# Patient Record
Sex: Female | Born: 1985 | Race: Black or African American | Hispanic: No | Marital: Single | Smoking: Never smoker
Health system: Southern US, Community
[De-identification: ages and names within clinical notes are randomized; demographics above are authoritative.]

## PROBLEM LIST (undated history)

## (undated) DIAGNOSIS — Z789 Other specified health status: Secondary | ICD-10-CM

## (undated) HISTORY — DX: Other specified health status: Z78.9

---

## 2012-03-26 NOTE — L&D Delivery Note (Signed)
Delivery Note At 12:11 AM a viable female was delivered via VBAC, Spontaneous (Presentation: Right Occiput Anterior).  APGAR: 9, 9; weight pending.   Placenta status: Intact, Spontaneous.  Cord: 3 vessels with the following complications: None.  Cord pH: NA.  Anesthesia: None  Episiotomy: None Lacerations: 2nd degree;Perineal;Cervical Suture Repair: 3.0 vicryl rapide Est. Blood Loss (mL): 450  Mom to postpartum.  Baby to Couplet care / Skin to Skin. Placenta to: BS Feeding: Breast Circ: NA Contraception: POPs/Depo  Casey Gonzalez 03/24/2013, 1:21 AM

## 2013-01-07 ENCOUNTER — Encounter: Payer: Self-pay | Admitting: Obstetrics & Gynecology

## 2013-01-07 ENCOUNTER — Ambulatory Visit (INDEPENDENT_AMBULATORY_CARE_PROVIDER_SITE_OTHER): Payer: Self-pay

## 2013-01-07 DIAGNOSIS — Z3201 Encounter for pregnancy test, result positive: Secondary | ICD-10-CM

## 2013-01-07 DIAGNOSIS — Z3200 Encounter for pregnancy test, result unknown: Secondary | ICD-10-CM

## 2013-01-08 LAB — OBSTETRIC PANEL
Basophils Relative: 0 % (ref 0–1)
Eosinophils Absolute: 0.2 10*3/uL (ref 0.0–0.7)
HCT: 34 % — ABNORMAL LOW (ref 36.0–46.0)
Hepatitis B Surface Ag: NEGATIVE
Lymphs Abs: 2 10*3/uL (ref 0.7–4.0)
MCH: 28.1 pg (ref 26.0–34.0)
MCHC: 31.8 g/dL (ref 30.0–36.0)
Monocytes Absolute: 0.8 10*3/uL (ref 0.1–1.0)
Neutro Abs: 8.7 10*3/uL — ABNORMAL HIGH (ref 1.7–7.7)
Neutrophils Relative %: 75 % (ref 43–77)
Platelets: 319 10*3/uL (ref 150–400)
RBC: 3.85 MIL/uL — ABNORMAL LOW (ref 3.87–5.11)
Rubella: 4.77 Index — ABNORMAL HIGH (ref <0.90)
WBC: 11.7 10*3/uL — ABNORMAL HIGH (ref 4.0–10.5)

## 2013-01-09 LAB — HEMOGLOBINOPATHY EVALUATION
Hemoglobin Other: 0 %
Hgb A2 Quant: 1.3 % — ABNORMAL LOW (ref 2.2–3.2)
Hgb A: 98.7 % — ABNORMAL HIGH (ref 96.8–97.8)
Hgb S Quant: 0 %

## 2013-01-09 LAB — PRESCRIPTION MONITORING PROFILE (19 PANEL)
Amphetamine/Meth: NEGATIVE ng/mL
Barbiturate Screen, Urine: NEGATIVE ng/mL
Benzodiazepine Screen, Urine: NEGATIVE ng/mL
Buprenorphine, Urine: NEGATIVE ng/mL
Carisoprodol, Urine: NEGATIVE ng/mL
MDMA URINE: NEGATIVE ng/mL
Meperidine, Ur: NEGATIVE ng/mL
Methaqualone: NEGATIVE ng/mL
Nitrites, Initial: NEGATIVE ug/mL
Oxycodone Screen, Ur: NEGATIVE ng/mL
Propoxyphene: NEGATIVE ng/mL
Tapentadol, urine: NEGATIVE ng/mL
Tramadol Scrn, Ur: NEGATIVE ng/mL
Zolpidem, Urine: NEGATIVE ng/mL

## 2013-01-12 ENCOUNTER — Ambulatory Visit (HOSPITAL_COMMUNITY): Admission: RE | Admit: 2013-01-12 | Payer: Self-pay | Source: Ambulatory Visit

## 2013-01-12 ENCOUNTER — Encounter (HOSPITAL_COMMUNITY): Payer: Self-pay

## 2013-01-12 ENCOUNTER — Ambulatory Visit (HOSPITAL_COMMUNITY)
Admission: RE | Admit: 2013-01-12 | Discharge: 2013-01-12 | Disposition: A | Discharge status: Home / Self Care | Payer: Self-pay | Source: Ambulatory Visit | Attending: Obstetrics & Gynecology | Admitting: Obstetrics & Gynecology

## 2013-01-12 DIAGNOSIS — Z3689 Encounter for other specified antenatal screening: Secondary | ICD-10-CM | POA: Insufficient documentation

## 2013-01-12 DIAGNOSIS — O0930 Supervision of pregnancy with insufficient antenatal care, unspecified trimester: Secondary | ICD-10-CM | POA: Insufficient documentation

## 2013-01-12 DIAGNOSIS — Z3200 Encounter for pregnancy test, result unknown: Secondary | ICD-10-CM

## 2013-01-13 ENCOUNTER — Encounter: Payer: Self-pay | Admitting: Obstetrics & Gynecology

## 2013-01-13 DIAGNOSIS — O0930 Supervision of pregnancy with insufficient antenatal care, unspecified trimester: Secondary | ICD-10-CM | POA: Insufficient documentation

## 2013-01-15 ENCOUNTER — Ambulatory Visit (INDEPENDENT_AMBULATORY_CARE_PROVIDER_SITE_OTHER): Payer: Self-pay | Admitting: Family

## 2013-01-15 ENCOUNTER — Encounter: Payer: Self-pay | Admitting: Family

## 2013-01-15 VITALS — BP 130/83 | Temp 98.3°F | Ht 66.0 in | Wt 227.1 lb

## 2013-01-15 DIAGNOSIS — O0930 Supervision of pregnancy with insufficient antenatal care, unspecified trimester: Secondary | ICD-10-CM

## 2013-01-15 DIAGNOSIS — Z98891 History of uterine scar from previous surgery: Secondary | ICD-10-CM

## 2013-01-15 DIAGNOSIS — O0933 Supervision of pregnancy with insufficient antenatal care, third trimester: Secondary | ICD-10-CM

## 2013-01-15 DIAGNOSIS — O34219 Maternal care for unspecified type scar from previous cesarean delivery: Secondary | ICD-10-CM | POA: Insufficient documentation

## 2013-01-15 LAB — POCT URINALYSIS DIP (DEVICE)
Bilirubin Urine: NEGATIVE
Hgb urine dipstick: NEGATIVE
Ketones, ur: NEGATIVE mg/dL
Specific Gravity, Urine: <=1.005 (ref 1.005–1.030)
pH: 6.5 (ref 5.0–8.0)

## 2013-01-15 LAB — GLUCOSE TOLERANCE, 1 HOUR (50G) W/O FASTING: Glucose, 1 Hour GTT: 105 mg/dL (ref 70–140)

## 2013-01-15 NOTE — Progress Notes (Signed)
Pulse- 119  Edema-ankles/feet  Pain/pressure-cramps/ when baby moves  Weight gain 25-35lb New ob packet given

## 2013-01-15 NOTE — Progress Notes (Signed)
New OB visit; too late for genetic testing.  Reviewed lab and ultrasound results with patient. Obtain 1 hr test today.  Pap smear with GC/CT collected.  Desires TOLAC, obtain consent at next visit.  Exam   BP 130/83  Temp(Src) 98.3 F (36.8 C)  Ht 5\' 6"  (1.676 m)  Wt 227 lb 1.6 oz (103.012 kg)  BMI 36.67 kg/m2 Uterine Size: size equals dates  Pelvic Exam:    Perineum: No Hemorrhoids, Normal Perineum   Vulva: normal   Vagina:  normal mucosa, normal discharge, no palpable nodules   pH: Not done   Cervix: no bleeding following Pap, no cervical motion tenderness and no lesions   Adnexa: normal adnexa and no mass, fullness, tenderness   Bony Pelvis: Adequate  System: Breast:  No nipple retraction or dimpling, No nipple discharge or bleeding, No axillary or supraclavicular adenopathy, Normal to palpation without dominant masses   Skin: normal coloration and turgor, no rashes    Neurologic: negative   Extremities: normal strength, tone, and muscle mass   HEENT neck supple with midline trachea and thyroid without masses   Mouth/Teeth mucous membranes moist, pharynx normal without lesions   Neck supple and no masses   Cardiovascular: regular rate and rhythm, no murmurs or gallops   Respiratory:  appears well, vitals normal, no respiratory distress, acyanotic, normal RR, neck free of mass or lymphadenopathy, chest clear, no wheezing, crepitations, rhonchi, normal symmetric air entry   Abdomen: soft, non-tender; bowel sounds normal; no masses,  no organomegaly; lower csection scar seen on abdomen.   Urinary: urethral meatus normal

## 2013-01-16 ENCOUNTER — Encounter: Payer: Self-pay | Admitting: Family

## 2013-01-20 ENCOUNTER — Encounter: Payer: Self-pay | Admitting: Family

## 2013-01-26 ENCOUNTER — Encounter: Payer: Self-pay | Admitting: Obstetrics & Gynecology

## 2013-01-29 ENCOUNTER — Encounter: Payer: Self-pay | Admitting: Obstetrics & Gynecology

## 2013-02-02 ENCOUNTER — Ambulatory Visit (INDEPENDENT_AMBULATORY_CARE_PROVIDER_SITE_OTHER): Payer: Self-pay | Admitting: Obstetrics & Gynecology

## 2013-02-02 VITALS — BP 131/84 | Wt 230.8 lb

## 2013-02-02 DIAGNOSIS — O0930 Supervision of pregnancy with insufficient antenatal care, unspecified trimester: Secondary | ICD-10-CM

## 2013-02-02 DIAGNOSIS — O0933 Supervision of pregnancy with insufficient antenatal care, third trimester: Secondary | ICD-10-CM

## 2013-02-02 DIAGNOSIS — O34219 Maternal care for unspecified type scar from previous cesarean delivery: Secondary | ICD-10-CM

## 2013-02-02 LAB — POCT URINALYSIS DIP (DEVICE)
Bilirubin Urine: NEGATIVE
Glucose, UA: NEGATIVE mg/dL
Ketones, ur: NEGATIVE mg/dL
Specific Gravity, Urine: 1.02 (ref 1.005–1.030)
pH: 6.5 (ref 5.0–8.0)

## 2013-02-02 NOTE — Patient Instructions (Signed)
Return to clinic for any obstetric concerns or go to [facility] for evaluation  

## 2013-02-02 NOTE — Progress Notes (Signed)
Pulse = 112 

## 2013-02-02 NOTE — Progress Notes (Signed)
Interpreter is present.  TOLAC consent signed.  No other complaints or concerns.  Fetal movement and labor precautions reviewed.

## 2013-02-05 ENCOUNTER — Encounter: Payer: Self-pay | Admitting: *Deleted

## 2013-02-16 ENCOUNTER — Ambulatory Visit (INDEPENDENT_AMBULATORY_CARE_PROVIDER_SITE_OTHER): Payer: Self-pay | Admitting: Obstetrics & Gynecology

## 2013-02-16 ENCOUNTER — Encounter: Payer: Self-pay | Admitting: Advanced Practice Midwife

## 2013-02-16 VITALS — BP 125/82 | Wt 234.4 lb

## 2013-02-16 DIAGNOSIS — O34219 Maternal care for unspecified type scar from previous cesarean delivery: Secondary | ICD-10-CM

## 2013-02-16 DIAGNOSIS — Z1389 Encounter for screening for other disorder: Secondary | ICD-10-CM

## 2013-02-16 DIAGNOSIS — O0933 Supervision of pregnancy with insufficient antenatal care, third trimester: Secondary | ICD-10-CM

## 2013-02-16 DIAGNOSIS — Z0489 Encounter for examination and observation for other specified reasons: Secondary | ICD-10-CM

## 2013-02-16 DIAGNOSIS — O0930 Supervision of pregnancy with insufficient antenatal care, unspecified trimester: Secondary | ICD-10-CM

## 2013-02-16 NOTE — Progress Notes (Signed)
Interpreter present.  OB follow up scan given inadequate scan at 30 weeks.  Counseled about Tdap and Flu vaccines, patient to call [facility] to obtain these if interested.  No other complaints or concerns.  Fetal movement and labor precautions reviewed.  Pelvic cultures next week.

## 2013-02-16 NOTE — Patient Instructions (Addendum)
   Call [facility]  [phone] for Tdap and Flu vaccines.  Mention you are Adopt a Mom.  Return to clinic for any obstetric concerns or go to [facility] for evaluation

## 2013-02-16 NOTE — Progress Notes (Signed)
Pulse 111  

## 2013-02-17 ENCOUNTER — Encounter: Payer: Self-pay | Admitting: Obstetrics & Gynecology

## 2013-02-23 ENCOUNTER — Encounter: Payer: Self-pay | Admitting: Obstetrics and Gynecology

## 2013-02-23 ENCOUNTER — Ambulatory Visit (INDEPENDENT_AMBULATORY_CARE_PROVIDER_SITE_OTHER): Payer: Self-pay | Admitting: Obstetrics and Gynecology

## 2013-02-23 VITALS — BP 117/81 | Temp 97.1°F | Wt 237.0 lb

## 2013-02-23 DIAGNOSIS — O0933 Supervision of pregnancy with insufficient antenatal care, third trimester: Secondary | ICD-10-CM

## 2013-02-23 DIAGNOSIS — O34219 Maternal care for unspecified type scar from previous cesarean delivery: Secondary | ICD-10-CM

## 2013-02-23 DIAGNOSIS — O0930 Supervision of pregnancy with insufficient antenatal care, unspecified trimester: Secondary | ICD-10-CM

## 2013-02-23 LAB — OB RESULTS CONSOLE GC/CHLAMYDIA
Chlamydia: NEGATIVE
Gonorrhea: NEGATIVE

## 2013-02-23 LAB — POCT URINALYSIS DIP (DEVICE)
Bilirubin Urine: NEGATIVE
Hgb urine dipstick: NEGATIVE
Protein, ur: NEGATIVE mg/dL
Urobilinogen, UA: 0.2 mg/dL (ref 0.0–1.0)

## 2013-02-23 LAB — OB RESULTS CONSOLE GBS: GBS: POSITIVE

## 2013-02-23 NOTE — Addendum Note (Signed)
Addended by: Catalina Antigua on: 02/23/2013 04:20 PM   Modules accepted: Orders

## 2013-02-23 NOTE — Progress Notes (Signed)
Pulse: 102

## 2013-02-23 NOTE — Progress Notes (Signed)
Patient doing well without complaints. Cultures collected. Patient reports fetal movement but not as strong as previously. Discussed to keep track of kick out and to come in for decreased fetal movement. FM/PTL precautions reviewed.

## 2013-02-24 ENCOUNTER — Ambulatory Visit (HOSPITAL_COMMUNITY)
Admission: RE | Admit: 2013-02-24 | Discharge: 2013-02-24 | Disposition: A | Discharge status: Home / Self Care | Payer: Self-pay | Source: Ambulatory Visit | Attending: Obstetrics & Gynecology | Admitting: Obstetrics & Gynecology

## 2013-02-24 DIAGNOSIS — O0930 Supervision of pregnancy with insufficient antenatal care, unspecified trimester: Secondary | ICD-10-CM | POA: Insufficient documentation

## 2013-02-24 DIAGNOSIS — Z3689 Encounter for other specified antenatal screening: Secondary | ICD-10-CM | POA: Insufficient documentation

## 2013-02-24 DIAGNOSIS — O34219 Maternal care for unspecified type scar from previous cesarean delivery: Secondary | ICD-10-CM

## 2013-02-24 DIAGNOSIS — O0933 Supervision of pregnancy with insufficient antenatal care, third trimester: Secondary | ICD-10-CM

## 2013-02-24 DIAGNOSIS — Z0489 Encounter for examination and observation for other specified reasons: Secondary | ICD-10-CM

## 2013-02-24 LAB — GC/CHLAMYDIA PROBE AMP: CT Probe RNA: NEGATIVE

## 2013-02-26 ENCOUNTER — Encounter: Payer: Self-pay | Admitting: Obstetrics & Gynecology

## 2013-02-26 LAB — CULTURE, BETA STREP (GROUP B ONLY)

## 2013-02-27 ENCOUNTER — Encounter: Payer: Self-pay | Admitting: Obstetrics and Gynecology

## 2013-02-27 DIAGNOSIS — O99820 Streptococcus B carrier state complicating pregnancy: Secondary | ICD-10-CM | POA: Insufficient documentation

## 2013-03-02 ENCOUNTER — Ambulatory Visit (INDEPENDENT_AMBULATORY_CARE_PROVIDER_SITE_OTHER): Payer: Self-pay | Admitting: Obstetrics and Gynecology

## 2013-03-02 VITALS — BP 121/77 | Temp 98.1°F | Wt 234.1 lb

## 2013-03-02 DIAGNOSIS — O34219 Maternal care for unspecified type scar from previous cesarean delivery: Secondary | ICD-10-CM

## 2013-03-02 LAB — POCT URINALYSIS DIP (DEVICE)
Glucose, UA: NEGATIVE mg/dL
Ketones, ur: NEGATIVE mg/dL
Leukocytes, UA: NEGATIVE
Nitrite: NEGATIVE
Protein, ur: NEGATIVE mg/dL
Urobilinogen, UA: 1 mg/dL (ref 0.0–1.0)
pH: 7.5 (ref 5.0–8.0)

## 2013-03-02 NOTE — Progress Notes (Signed)
Still wants TOLAC and epidural. Reviewed plans and encouraged LARC over OCP. Good FM. No LOF, scant white vaginal discharge is non-irritative.

## 2013-03-02 NOTE — Progress Notes (Signed)
P=93,   [employer]. States she is not sure,but maybe water is coming out with her urine- because urine is white

## 2013-03-03 NOTE — Patient Instructions (Signed)
Vaginal Bleeding During Pregnancy, Third Trimester °A small amount of bleeding (spotting) is relatively common in pregnancy. Sometimes bleeding may be "normal." Bleeding in the third trimester can be very serious for the mother and the baby, and should be reported to your caregiver right away. It is very important to follow your caregiver's instructions. °CAUSES °Possible causes of bleeding during the third trimester: °· The placenta may be partially covering or completely covering the opening to the cervix (placenta previa). °· The placenta may have separated from the uterus (abruption of the placenta). °· There may be an infection or growth on the cervix. °· You may be starting labor, called discharging of the mucus plug. °· The placenta may grow into the muscle layer of the uterus (placenta accreta). °DIAGNOSIS  °Your caregiver may do: °· Pelvic exam in the delivery room, called a double set up (being ready to do an emergency cesarean delivery, if necessary). °· Blood tests, to see if you are anemic (not having enough red blood cells). °· Ultrasound and fetal monitoring, to see if the baby is having problems. °· Ultrasound, to find out why you are bleeding. °TREATMENT  °· You may need to remain in the hospital. °· You may be given an IV (intravenous) while you are in the hospital. °· You may be put on strict bed rest. °· You may need a blood transfusion. °· You may be placed on oxygen. °· The baby may need to be delivered immediately. °HOME CARE INSTRUCTIONS  °· Your caregiver may order bed rest (getting up to go to the bathroom only). At this time, you may need to make arrangements for the care of children and for other responsibilities. °· Keep track of the number of pads you use each day and how soaked (saturated) they are. Write this down. °· Do not use tampons. Do not douche. °· Do not have sexual intercourse or any sexual activity that may cause an orgasm, until approved by your caregiver. °· Follow your  caregiver's advice about lifting, driving, physical and social activities. °· Eat a balanced and nutritious diet. °· Get plenty of rest and sleep. °· Do not drink alcohol or smoke. °SEEK IMMEDIATE MEDICAL CARE IF:  °· You experience severe cramps or pain in your back or belly (abdomen). °· You have an oral temperature above 102° F (38.9° C), not controlled by medicine. °· You develop chills. °· You have a gush of fluid from the vagina. °· You pass large clots or tissue. Save any tissue for your caregiver to inspect. °· Your bleeding increases or you become light-headed or weak. °· You pass out. °· You feel less movement or no movement of the baby. °Document Released: 06/02/2002 Document Revised: 06/04/2011 Document Reviewed: 02/07/2009 °ExitCare® Patient Information ©2014 [employer]. ° °

## 2013-03-09 ENCOUNTER — Ambulatory Visit (INDEPENDENT_AMBULATORY_CARE_PROVIDER_SITE_OTHER): Payer: Self-pay | Admitting: Advanced Practice Midwife

## 2013-03-09 VITALS — BP 123/81 | Temp 97.1°F | Wt 237.0 lb

## 2013-03-09 DIAGNOSIS — O0930 Supervision of pregnancy with insufficient antenatal care, unspecified trimester: Secondary | ICD-10-CM

## 2013-03-09 DIAGNOSIS — O34219 Maternal care for unspecified type scar from previous cesarean delivery: Secondary | ICD-10-CM

## 2013-03-09 DIAGNOSIS — Z3483 Encounter for supervision of other normal pregnancy, third trimester: Secondary | ICD-10-CM

## 2013-03-09 DIAGNOSIS — O09899 Supervision of other high risk pregnancies, unspecified trimester: Secondary | ICD-10-CM

## 2013-03-09 DIAGNOSIS — Z3480 Encounter for supervision of other normal pregnancy, unspecified trimester: Secondary | ICD-10-CM | POA: Insufficient documentation

## 2013-03-09 LAB — POCT URINALYSIS DIP (DEVICE)
Glucose, UA: NEGATIVE mg/dL
Leukocytes, UA: NEGATIVE
Nitrite: NEGATIVE
Protein, ur: NEGATIVE mg/dL
Urobilinogen, UA: 0.2 mg/dL (ref 0.0–1.0)

## 2013-03-09 NOTE — Progress Notes (Signed)
Increased BH. Comfort measures.

## 2013-03-09 NOTE — Patient Instructions (Signed)
Braxton Hicks Contractions °Pregnancy is commonly associated with contractions of the uterus throughout the pregnancy. Towards the end of pregnancy (32 to 34 weeks), these contractions (Braxton Hicks) can develop more often and may become more forceful. This is not true labor because these contractions do not result in opening (dilatation) and thinning of the cervix. They are sometimes difficult to tell apart from true labor because these contractions can be forceful and people have different pain tolerances. You should not feel embarrassed if you go to the hospital with false labor. Sometimes, the only way to tell if you are in true labor is for your caregiver to follow the changes in the cervix. °How to tell the difference between true and false labor: °· False labor. °· The contractions of false labor are usually shorter, irregular and not as hard as those of true labor. °· They are often felt in the front of the lower abdomen and in the groin. °· They may leave with walking around or changing positions while lying down. °· They get weaker and are shorter lasting as time goes on. °· These contractions are usually irregular. °· They do not usually become progressively stronger, regular and closer together as with true labor. °· True labor. °· Contractions in true labor last 30 to 70 seconds, become very regular, usually become more intense, and increase in frequency. °· They do not go away with walking. °· The discomfort is usually felt in the top of the uterus and spreads to the lower abdomen and low back. °· True labor can be determined by your caregiver with an exam. This will show that the cervix is dilating and getting thinner. °If there are no prenatal problems or other health problems associated with the pregnancy, it is completely safe to be sent home with false labor and await the onset of true labor. °HOME CARE INSTRUCTIONS  °· Keep up with your usual exercises and instructions. °· Take medications as  directed. °· Keep your regular prenatal appointment. °· Eat and drink lightly if you think you are going into labor. °· If BH contractions are making you uncomfortable: °· Change your activity position from lying down or resting to walking/walking to resting. °· Sit and rest in a tub of warm water. °· Drink 2 to 3 glasses of water. Dehydration may cause B-H contractions. °· Do slow and deep breathing several times an hour. °SEEK IMMEDIATE MEDICAL CARE IF:  °· Your contractions continue to become stronger, more regular, and closer together. °· You have a gushing, burst or leaking of fluid from the vagina. °· An oral temperature above 102° F (38.9° C) develops. °· You have passage of blood-tinged mucus. °· You develop vaginal bleeding. °· You develop continuous belly (abdominal) pain. °· You have low back pain that you never had before. °· You feel the baby's head pushing down causing pelvic pressure. °· The baby is not moving as much as it used to. °Document Released: 03/12/2005 Document Revised: 06/04/2011 Document Reviewed: 09/03/2008 °ExitCare® Patient Information ©2014 [employer]. ° ° °Fetal Movement Counts °Patient Name: __________________________________________________ Patient Due Date: ____________________ °Performing a fetal movement count is highly recommended in high-risk pregnancies, but it is good for every pregnant woman to do. Your caregiver may ask you to start counting fetal movements at 28 weeks of the pregnancy. Fetal movements often increase: °· After eating a full meal. °· After physical activity. °· After eating or drinking something sweet or cold. °· At rest. °Pay attention to when you   the baby is most active. This will help you notice a pattern of your baby's sleep and wake cycles and what factors contribute to an increase in fetal movement. It is important to perform a fetal movement count at the same time each day when your baby is normally most active.  HOW TO COUNT FETAL  MOVEMENTS 1. Find a quiet and comfortable area to sit or lie down on your left side. Lying on your left side provides the best blood and oxygen circulation to your baby. 2. Write down the day and time on a sheet of paper or in a journal. 3. Start counting kicks, flutters, swishes, rolls, or jabs in a 2 hour period. You should feel at least 10 movements within 2 hours. 4. If you do not feel 10 movements in 2 hours, wait 2 3 hours and count again. Look for a change in the pattern or not enough counts in 2 hours. SEEK MEDICAL CARE IF:  You feel less than 10 counts in 2 hours, tried twice.  There is no movement in over an hour.  The pattern is changing or taking longer each day to reach 10 counts in 2 hours.  You feel the baby is not moving as he or she usually does. Date: ____________ Movements: ____________ Start time: ____________ Finish time: ____________  Date: ____________ Movements: ____________ Start time: ____________ Finish time: ____________ Date: ____________ Movements: ____________ Start time: ____________ Finish time: ____________ Date: ____________ Movements: ____________ Start time: ____________ Finish time: ____________ Date: ____________ Movements: ____________ Start time: ____________ Finish time: ____________ Date: ____________ Movements: ____________ Start time: ____________ Finish time: ____________ Date: ____________ Movements: ____________ Start time: ____________ Finish time: ____________ Date: ____________ Movements: ____________ Start time: ____________ Finish time: ____________  Date: ____________ Movements: ____________ Start time: ____________ Finish time: ____________ Date: ____________ Movements: ____________ Start time: ____________ Finish time: ____________ Date: ____________ Movements: ____________ Start time: ____________ Finish time: ____________ Date: ____________ Movements: ____________ Start time: ____________ Finish time: ____________ Date: ____________  Movements: ____________ Start time: ____________ Finish time: ____________ Date: ____________ Movements: ____________ Start time: ____________ Finish time: ____________ Date: ____________ Movements: ____________ Start time: ____________ Finish time: ____________  Date: ____________ Movements: ____________ Start time: ____________ Finish time: ____________ Date: ____________ Movements: ____________ Start time: ____________ Finish time: ____________ Date: ____________ Movements: ____________ Start time: ____________ Finish time: ____________ Date: ____________ Movements: ____________ Start time: ____________ Finish time: ____________ Date: ____________ Movements: ____________ Start time: ____________ Finish time: ____________ Date: ____________ Movements: ____________ Start time: ____________ Finish time: ____________ Date: ____________ Movements: ____________ Start time: ____________ Finish time: ____________  Date: ____________ Movements: ____________ Start time: ____________ Finish time: ____________ Date: ____________ Movements: ____________ Start time: ____________ Finish time: ____________ Date: ____________ Movements: ____________ Start time: ____________ Finish time: ____________ Date: ____________ Movements: ____________ Start time: ____________ Finish time: ____________ Date: ____________ Movements: ____________ Start time: ____________ Finish time: ____________ Date: ____________ Movements: ____________ Start time: ____________ Finish time: ____________ Date: ____________ Movements: ____________ Start time: ____________ Finish time: ____________  Date: ____________ Movements: ____________ Start time: ____________ Finish time: ____________ Date: ____________ Movements: ____________ Start time: ____________ Finish time: ____________ Date: ____________ Movements: ____________ Start time: ____________ Finish time: ____________ Date: ____________ Movements: ____________ Start time:  ____________ Finish time: ____________ Date: ____________ Movements: ____________ Start time: ____________ Finish time: ____________ Date: ____________ Movements: ____________ Start time: ____________ Finish time: ____________ Date: ____________ Movements: ____________ Start time: ____________ Finish time: ____________  Date: ____________ Movements: ____________ Start time: ____________ Finish time: ____________ Date: ____________ Movements: ____________ Start   time: ____________ Finish time: ____________ Date: ____________ Movements: ____________ Start time: ____________ Finish time: ____________ Date: ____________ Movements: ____________ Start time: ____________ Finish time: ____________ Date: ____________ Movements: ____________ Start time: ____________ Finish time: ____________ Date: ____________ Movements: ____________ Start time: ____________ Finish time: ____________ Date: ____________ Movements: ____________ Start time: ____________ Finish time: ____________  Date: ____________ Movements: ____________ Start time: ____________ Finish time: ____________ Date: ____________ Movements: ____________ Start time: ____________ Finish time: ____________ Date: ____________ Movements: ____________ Start time: ____________ Finish time: ____________ Date: ____________ Movements: ____________ Start time: ____________ Finish time: ____________ Date: ____________ Movements: ____________ Start time: ____________ Finish time: ____________ Date: ____________ Movements: ____________ Start time: ____________ Finish time: ____________ Date: ____________ Movements: ____________ Start time: ____________ Finish time: ____________  Date: ____________ Movements: ____________ Start time: ____________ Finish time: ____________ Date: ____________ Movements: ____________ Start time: ____________ Finish time: ____________ Date: ____________ Movements: ____________ Start time: ____________ Finish time: ____________ Date:  ____________ Movements: ____________ Start time: ____________ Finish time: ____________ Date: ____________ Movements: ____________ Start time: ____________ Finish time: ____________ Date: ____________ Movements: ____________ Start time: ____________ Finish time: ____________ Document Released: 04/11/2006 Document Revised: 02/27/2012 Document Reviewed: 01/07/2012 ExitCare Patient Information 2014 [employer].  

## 2013-03-09 NOTE — Progress Notes (Signed)
P= 87 Edema in feet. Pressure lower abdominal/pelvic area.

## 2013-03-16 ENCOUNTER — Ambulatory Visit (INDEPENDENT_AMBULATORY_CARE_PROVIDER_SITE_OTHER): Payer: Self-pay | Admitting: Obstetrics and Gynecology

## 2013-03-16 ENCOUNTER — Encounter: Payer: Self-pay | Admitting: Obstetrics and Gynecology

## 2013-03-16 VITALS — BP 136/80 | Temp 97.7°F | Wt 239.4 lb

## 2013-03-16 DIAGNOSIS — O09899 Supervision of other high risk pregnancies, unspecified trimester: Secondary | ICD-10-CM

## 2013-03-16 DIAGNOSIS — O34219 Maternal care for unspecified type scar from previous cesarean delivery: Secondary | ICD-10-CM

## 2013-03-16 DIAGNOSIS — Z3480 Encounter for supervision of other normal pregnancy, unspecified trimester: Secondary | ICD-10-CM

## 2013-03-16 DIAGNOSIS — Z22330 Carrier of Group B streptococcus: Secondary | ICD-10-CM

## 2013-03-16 DIAGNOSIS — O0930 Supervision of pregnancy with insufficient antenatal care, unspecified trimester: Secondary | ICD-10-CM

## 2013-03-16 DIAGNOSIS — Z3483 Encounter for supervision of other normal pregnancy, third trimester: Secondary | ICD-10-CM

## 2013-03-16 DIAGNOSIS — O99820 Streptococcus B carrier state complicating pregnancy: Secondary | ICD-10-CM

## 2013-03-16 DIAGNOSIS — O0933 Supervision of pregnancy with insufficient antenatal care, third trimester: Secondary | ICD-10-CM

## 2013-03-16 LAB — POCT URINALYSIS DIP (DEVICE)
Bilirubin Urine: NEGATIVE
Bilirubin Urine: NEGATIVE
Glucose, UA: NEGATIVE mg/dL
Glucose, UA: NEGATIVE mg/dL
Hgb urine dipstick: NEGATIVE
Ketones, ur: NEGATIVE mg/dL
Nitrite: NEGATIVE
Protein, ur: NEGATIVE mg/dL
Specific Gravity, Urine: <=1.005 (ref 1.005–1.030)
Urobilinogen, UA: 0.2 mg/dL (ref 0.0–1.0)
Urobilinogen, UA: 0.2 mg/dL (ref 0.0–1.0)

## 2013-03-16 NOTE — Progress Notes (Signed)
Patient doing well without complaints. FM/labor precautions reviewed. Will start postdate testing at next visit. Will schedule IOL on 1/2

## 2013-03-16 NOTE — Progress Notes (Signed)
Pulse- 95  Pain-lower abd

## 2013-03-16 NOTE — Progress Notes (Signed)
IOL scheduled 03/27/13 at 730 pm.

## 2013-03-17 ENCOUNTER — Telehealth (HOSPITAL_COMMUNITY): Payer: Self-pay | Admitting: *Deleted

## 2013-03-17 NOTE — Telephone Encounter (Signed)
Preadmission screen Interpreter number [id number] 

## 2013-03-18 ENCOUNTER — Encounter (HOSPITAL_COMMUNITY): Payer: Self-pay | Admitting: General Practice

## 2013-03-18 ENCOUNTER — Inpatient Hospital Stay (HOSPITAL_COMMUNITY)
Admission: AD | Admit: 2013-03-18 | Discharge: 2013-03-18 | Disposition: A | Discharge status: Home / Self Care | Payer: Self-pay | Source: Ambulatory Visit | Attending: Obstetrics & Gynecology | Admitting: Obstetrics & Gynecology

## 2013-03-18 DIAGNOSIS — O4290 Premature rupture of membranes, unspecified as to length of time between rupture and onset of labor, unspecified weeks of gestation: Secondary | ICD-10-CM | POA: Insufficient documentation

## 2013-03-18 DIAGNOSIS — O34219 Maternal care for unspecified type scar from previous cesarean delivery: Secondary | ICD-10-CM

## 2013-03-18 NOTE — [facility] Note (Signed)
Fluid on her underwear.  Cramping, no bleeding

## 2013-03-18 NOTE — MAU Provider Note (Signed)
Chief Complaint:  Rupture of Membranes   Ryanna Touraoua-Djibril is a 27 y.o.  G2P1001 with IUP at [redacted]w[redacted]d presenting for r/o ROM.   27 y.o. G2P1001 @[redacted]w[redacted]d  pt of [facility] presents to [facility] with report of leakage of fluid. She has hx of C/S and desires TOLAC with this pregnancy.    States has a hx of c/s with first baby after PROM and possible chorio. Because of this she wanted to "make sure her water didn't break". Has been having some watery discharge on and off but no change in what she has been doing for the last few weeks.  No contractions that she is feeling. No VB. +FM.    Menstrual History: OB History   Grav Para Term Preterm Abortions TAB SAB Ect Mult Living   2 1 1       1       G1- LTCS for PROM and likely chorioamnionitis.    MNo LMP recorded. Patient is pregnant.      Past Medical History  Diagnosis Date  . Medical history non-contributory     Past Surgical History  Procedure Laterality Date  . Cesarean section  2012    Family History  Problem Relation Age of Onset  . Asthma Mother   . Hypertension Mother     History  Substance Use Topics  . Smoking status: Never Smoker   . Smokeless tobacco: Never Used  . Alcohol Use: No     No Known Allergies  Prescriptions prior to admission  Medication Sig Dispense Refill  . Alum & Mag Hydroxide-Simeth (ANTACID I PO) Take 1 tablet by mouth as needed.      . ferrous sulfate 325 (65 FE) MG tablet Take 325 mg by mouth daily.      . Prenatal Vit-Fe Fumarate-FA (PRENATAL MULTIVITAMIN) TABS tablet Take 1 tablet by mouth daily at 12 noon.        Review of Systems - Negative except for what is mentioned in HPI.  Physical Exam  Blood pressure 135/88, pulse 105, temperature 98.7 F (37.1 C), temperature source Oral, resp. rate 20, height 5\' 7"  (1.702 m), weight 109.317 kg (241 lb). GENERAL: Well-developed, well-nourished female in no acute distress.  LUNGS: Clear to auscultation bilaterally.  HEART: Regular rate and  rhythm. ABDOMEN: Soft, nontender, nondistended, gravid.  EXTREMITIES: Nontender, no edema, 2+ distal pulses. SSE: normal external genitalia, cervix, vagina, adnexa.  Neg pool even with valsalva. Neg fern Cervical Exam: Dilatation 1.5 cm   Effacement 70%   Station -3   Presentation: cephalic FHT:  Baseline rate 145 bpm   Variability moderate, with periods of min  Accelerations present   Decelerations none Contractions: one on monitor thus far.    Labs: No results found for this or any previous visit (from the past 24 hour(s)).  Imaging Studies:  US Ob Follow Up  02/24/2013   OBSTETRICAL ULTRASOUND: This exam was performed within a [facility]. The OB US report was generated in the [facility] system, and faxed to the ordering physician.   This report is also available in [employer] and in the [employer]. See [facility] Obstetric US report.   Assessment: Mikylah Touraoua-Djibril is  27 y.o. G2P1001 at 104w5d presents with Rupture of Membranes .  Plan: 1) ?SROM - no story of leakage of fluid. Pt just wants reassurance per own report - speculum exam negative for pool. Neg fern.  - reassurance given - return precautions discussed.   2) FWB - cat  I tracing with some sleep cycles occasionally.   3) f/u as scheduled in clinic.   Aaronmichael Brumbaugh L 12/24/20142:22 PM

## 2013-03-20 NOTE — MAU Provider Note (Signed)
Attestation of Attending Supervision of Fellow: Evaluation and management procedures were performed by the Fellow under my supervision and collaboration.  I have reviewed the Fellow's note and chart, and I agree with the management and plan.    

## 2013-03-23 ENCOUNTER — Ambulatory Visit (HOSPITAL_COMMUNITY)
Admission: RE | Admit: 2013-03-23 | Discharge: 2013-03-23 | Disposition: A | Discharge status: Home / Self Care | Payer: Self-pay | Source: Ambulatory Visit | Attending: Family Medicine | Admitting: Family Medicine

## 2013-03-23 ENCOUNTER — Inpatient Hospital Stay (HOSPITAL_COMMUNITY)
Admission: AD | Admit: 2013-03-23 | Discharge: 2013-03-26 | DRG: 775 | Disposition: A | Discharge status: Home / Self Care | Payer: Self-pay | Source: Ambulatory Visit | Attending: Obstetrics & Gynecology | Admitting: Obstetrics & Gynecology

## 2013-03-23 ENCOUNTER — Ambulatory Visit (INDEPENDENT_AMBULATORY_CARE_PROVIDER_SITE_OTHER): Payer: Self-pay | Admitting: Family Medicine

## 2013-03-23 ENCOUNTER — Encounter (HOSPITAL_COMMUNITY): Payer: Self-pay

## 2013-03-23 VITALS — BP 136/75 | Temp 97.8°F

## 2013-03-23 DIAGNOSIS — Z3483 Encounter for supervision of other normal pregnancy, third trimester: Secondary | ICD-10-CM

## 2013-03-23 DIAGNOSIS — O34219 Maternal care for unspecified type scar from previous cesarean delivery: Secondary | ICD-10-CM

## 2013-03-23 DIAGNOSIS — O0930 Supervision of pregnancy with insufficient antenatal care, unspecified trimester: Secondary | ICD-10-CM

## 2013-03-23 DIAGNOSIS — O4100X0 Oligohydramnios, unspecified trimester, not applicable or unspecified: Secondary | ICD-10-CM | POA: Insufficient documentation

## 2013-03-23 DIAGNOSIS — O480 Post-term pregnancy: Secondary | ICD-10-CM | POA: Insufficient documentation

## 2013-03-23 DIAGNOSIS — Z22330 Carrier of Group B streptococcus: Secondary | ICD-10-CM

## 2013-03-23 DIAGNOSIS — O99892 Other specified diseases and conditions complicating childbirth: Secondary | ICD-10-CM | POA: Diagnosis present

## 2013-03-23 DIAGNOSIS — O0933 Supervision of pregnancy with insufficient antenatal care, third trimester: Secondary | ICD-10-CM

## 2013-03-23 LAB — CBC
HCT: 37.1 % (ref 36.0–46.0)
Hemoglobin: 11.9 g/dL — ABNORMAL LOW (ref 12.0–15.0)
MCHC: 32.1 g/dL (ref 30.0–36.0)
MCV: 89 fL (ref 78.0–100.0)
RBC: 4.17 MIL/uL (ref 3.87–5.11)
RDW: 12.8 % (ref 11.5–15.5)
WBC: 13.1 10*3/uL — ABNORMAL HIGH (ref 4.0–10.5)

## 2013-03-23 LAB — COMPREHENSIVE METABOLIC PANEL
AST: 20 U/L (ref 0–37)
Albumin: 3.1 g/dL — ABNORMAL LOW (ref 3.5–5.2)
Alkaline Phosphatase: 178 U/L — ABNORMAL HIGH (ref 39–117)
BUN: 6 mg/dL (ref 6–23)
CO2: 23 mEq/L (ref 19–32)
Calcium: 9.5 mg/dL (ref 8.4–10.5)
Chloride: 102 mEq/L (ref 96–112)
Creatinine, Ser: 0.66 mg/dL (ref 0.50–1.10)
GFR calc Af Amer: >90 mL/min (ref >90)
GFR calc non Af Amer: >90 mL/min (ref >90)
Glucose, Bld: 67 mg/dL — ABNORMAL LOW (ref 70–99)
Potassium: 4.3 mEq/L (ref 3.5–5.1)
Total Bilirubin: 0.4 mg/dL (ref 0.3–1.2)

## 2013-03-23 LAB — POCT URINALYSIS DIP (DEVICE)
Bilirubin Urine: NEGATIVE
Glucose, UA: NEGATIVE mg/dL
Nitrite: NEGATIVE
Specific Gravity, Urine: 1.02 (ref 1.005–1.030)
Urobilinogen, UA: 0.2 mg/dL (ref 0.0–1.0)
pH: 6 (ref 5.0–8.0)

## 2013-03-23 LAB — ABO/RH: ABO/RH(D): O POS

## 2013-03-23 LAB — PROTEIN / CREATININE RATIO, URINE: Protein Creatinine Ratio: 0.15 (ref 0.00–0.15)

## 2013-03-23 LAB — TYPE AND SCREEN
ABO/RH(D): O POS
Antibody Screen: NEGATIVE

## 2013-03-23 LAB — RPR: RPR Ser Ql: NONREACTIVE

## 2013-03-23 MED ORDER — PENICILLIN G POTASSIUM 5000000 UNITS IJ SOLR
5.0000 10*6.[IU] | Freq: Once | INTRAVENOUS | Status: AC
  Administered 2013-03-23: 5 10*6.[IU] via INTRAVENOUS
  Filled 2013-03-23: qty 5

## 2013-03-23 MED ORDER — FENTANYL 2.5 MCG/ML BUPIVACAINE 1/10 % EPIDURAL INFUSION (WH - ANES): 14.0000 mL/h | INTRAMUSCULAR | Status: DC | PRN

## 2013-03-23 MED ORDER — FENTANYL CITRATE 0.05 MG/ML IJ SOLN
100.0000 ug | INTRAMUSCULAR | Status: DC | PRN
  Administered 2013-03-23: 100 ug via INTRAVENOUS
  Filled 2013-03-23: qty 2

## 2013-03-23 MED ORDER — IBUPROFEN 600 MG PO TABS
600.0000 mg | ORAL_TABLET | Freq: Four times a day (QID) | ORAL | Status: DC | PRN
  Filled 2013-03-23: qty 1

## 2013-03-23 MED ORDER — PENICILLIN G POTASSIUM 5000000 UNITS IJ SOLR
2.5000 10*6.[IU] | INTRAMUSCULAR | Status: DC
  Administered 2013-03-23 (×2): 2.5 10*6.[IU] via INTRAVENOUS
  Filled 2013-03-23 (×6): qty 2.5

## 2013-03-23 MED ORDER — EPHEDRINE 5 MG/ML INJ
10.0000 mg | INTRAVENOUS | Status: DC | PRN
  Filled 2013-03-23: qty 2

## 2013-03-23 MED ORDER — ACETAMINOPHEN 325 MG PO TABS: 650.0000 mg | ORAL_TABLET | ORAL | Status: DC | PRN

## 2013-03-23 MED ORDER — DIPHENHYDRAMINE HCL 50 MG/ML IJ SOLN: 12.5000 mg | INTRAMUSCULAR | Status: DC | PRN

## 2013-03-23 MED ORDER — OXYTOCIN 40 UNITS IN LACTATED RINGERS INFUSION - SIMPLE MED: 1.0000 m[IU]/min | INTRAVENOUS | Status: DC

## 2013-03-23 MED ORDER — PHENYLEPHRINE 40 MCG/ML (10ML) SYRINGE FOR IV PUSH (FOR BLOOD PRESSURE SUPPORT)
80.0000 ug | PREFILLED_SYRINGE | INTRAVENOUS | Status: DC | PRN
  Filled 2013-03-23: qty 2

## 2013-03-23 MED ORDER — LACTATED RINGERS IV SOLN
INTRAVENOUS | Status: DC
  Administered 2013-03-23 (×2): via INTRAVENOUS

## 2013-03-23 MED ORDER — TERBUTALINE SULFATE 1 MG/ML IJ SOLN: 0.2500 mg | Freq: Once | INTRAMUSCULAR | Status: AC | PRN

## 2013-03-23 MED ORDER — OXYTOCIN 40 UNITS IN LACTATED RINGERS INFUSION - SIMPLE MED
1.0000 m[IU]/min | INTRAVENOUS | Status: DC
  Administered 2013-03-23: 4 m[IU]/min via INTRAVENOUS
  Administered 2013-03-23: 2 m[IU]/min via INTRAVENOUS

## 2013-03-23 MED ORDER — ONDANSETRON HCL 4 MG/2ML IJ SOLN
4.0000 mg | Freq: Four times a day (QID) | INTRAMUSCULAR | Status: DC | PRN
  Administered 2013-03-23: 4 mg via INTRAVENOUS
  Filled 2013-03-23: qty 2

## 2013-03-23 MED ORDER — LIDOCAINE HCL (PF) 1 % IJ SOLN
30.0000 mL | INTRAMUSCULAR | Status: AC | PRN
  Administered 2013-03-24: 30 mL via SUBCUTANEOUS
  Filled 2013-03-23 (×2): qty 30

## 2013-03-23 MED ORDER — LACTATED RINGERS IV SOLN: 500.0000 mL | Freq: Once | INTRAVENOUS | Status: DC

## 2013-03-23 MED ORDER — CITRIC ACID-SODIUM CITRATE 334-500 MG/5ML PO SOLN: 30.0000 mL | ORAL | Status: DC | PRN

## 2013-03-23 MED ORDER — LACTATED RINGERS IV SOLN
500.0000 mL | INTRAVENOUS | Status: DC | PRN
  Administered 2013-03-23: 500 mL via INTRAVENOUS

## 2013-03-23 MED ORDER — OXYCODONE-ACETAMINOPHEN 5-325 MG PO TABS: 1.0000 | ORAL_TABLET | ORAL | Status: DC | PRN

## 2013-03-23 MED ORDER — OXYTOCIN BOLUS FROM INFUSION
500.0000 mL | INTRAVENOUS | Status: DC
  Administered 2013-03-24: 500 mL via INTRAVENOUS

## 2013-03-23 MED ORDER — OXYTOCIN 40 UNITS IN LACTATED RINGERS INFUSION - SIMPLE MED
62.5000 mL/h | INTRAVENOUS | Status: DC
  Administered 2013-03-24: 62.5 mL/h via INTRAVENOUS
  Filled 2013-03-23: qty 1000

## 2013-03-23 NOTE — Progress Notes (Signed)
NST reviewed and non-reactive--for BPP and fluid check. IOL scheduled. Labor precautions given.

## 2013-03-23 NOTE — H&P (Signed)
Casey Gonzalez is a 27 y.o. female presenting for IOL for oligohydramnios. Pt is a TOLAC with consent signed. Pt prior c-section related to oligohydramnios per patient in [place]. Pt states that she has no other complaint st this time. No headaches, vision changes, no RUQ pain, no significant edema.   No LOF, no Vb, no ctx, +FM  No f/c, sob, n/v, d/c, or other complaints  History OB History   Grav Para Term Preterm Abortions TAB SAB Ect Mult Living   2 1 1       1      Past Medical History  Diagnosis Date  . Medical history non-contributory    Past Surgical History  Procedure Laterality Date  . Cesarean section  2012   Family History: family history includes Asthma in her mother; Hypertension in her mother. Social History:  reports that she has never smoked. She has never used smokeless tobacco. She reports that she does not drink alcohol or use illicit drugs.   Clinic HR/LR  Anatomic [place] Normal  GTT 105  GBS  pos  Baby Food Breast  Contraception OCPs  Pediatrician [facility]  Pap negative; GC/CT neg x 2  Prenatal Transfer Tool  Maternal Diabetes: no  Genetic Screening: Declined Maternal Ultrasounds/Referrals: Normal Fetal Ultrasounds or other Referrals:  None Maternal Substance Abuse:  No Significant Maternal Medications:  None Significant Maternal Lab Results:  Lab values include: Group B Strep positive Other Comments:  None  ROS  Blood pressure 145/73, pulse 112, temperature 98.1 F (36.7 C), temperature source Oral, resp. rate 18, height 5\' 7"  (1.702 m), weight 108.41 kg (239 lb). Filed Vitals:   03/23/13 1355 03/23/13 1417 03/23/13 1426  BP: 145/113  145/73  Pulse: 108  112  Temp: 98.1 F (36.7 C)    TempSrc: Oral    Resp: 18    Height:  5\' 7"  (1.702 m)   Weight:  108.41 kg (239 lb)      Exam Physical Exam  VSS, NAD RRR no mgt CTAB no wrc Gravid NTTP, Leopold 3400g No c/c/e  Dilation: 3 Effacement (%): 80 Station: -1 Exam  by:: Dr. Ike Bene  FHT 130-150s mod var, mult accels, ?Decel or changing baseline from 120-150 Toco: no ctx  Prenatal labs: ABO, Rh: O/POS/-- (10/15 1354) Antibody: NEG (10/15 1354) Rubella: 4.77 (10/15 1354) RPR: NON REAC (10/15 1354)  HBsAg: NEGATIVE (10/15 1354)  HIV: NON REACTIVE (10/15 1354)  GBS: Positive (12/01 0000)   Assessment/Plan: Casey Gonzalez is a 27 y.o. G2P1001 at [redacted]w[redacted]d  here for IOL for oligohydramnios #Labor: Pt is a tolac, with a favorable cervix, will place foley bulb to maximize mechnical dilation, then move to pit #Pain: Desires IV pain meds and epidrual #FWB: Cat II, cont monitoring #ID:  GBS+, start PCN #MOF: Breast #MOC:OCP #Elevated BP: will check labs at this time, 113 DBP signficantly improved and likely related to blood draw. Will not mag or tx at this time.    Tawana Scale 03/23/2013, 2:57 PM

## 2013-03-23 NOTE — Patient Instructions (Addendum)
L'allaitement maternel Dcider d'allaiter est l'un des [employer] que vous pouvez faire pour vous et votre bb. Un changement des hormones pendant la grossesse entrane votre tissu mammaire  crotre et  augmenter le nombre et la taille de vos conduits de lait. Ces hormones permettent galement des protines, des sucres et graisses de votre alimentation en sang pour [employer] lait maternel dans vos glandes productrices de lait. Hormones empchent le lait maternel d'tre libr avant que votre bb est n ainsi que le flux de lait invite aprs la naissance. Une fois que l'allaitement maternel a commenc, les penses de votre bb, ainsi que son aspiration ou de pleurer, peut stimuler la libration de lait de vos glandes productrices de lait. Avantages de Nurse, mental health bb Votre premier lait (colostrum) permet le fonctionnement du systme digestif de votre bb mieux. Il sont des [employer] lait qui aident votre bb  combattre les infections. Votre bb a une plus faible incidence de l'asthme, les allergies et le syndrome de mort subite du nourrisson. Les nutriments contenus Secondary school teacher lait maternel sont mieux pour votre bb que les prparations pour nourrissons et sont conus uniquement pour les besoins de votre bb. Le lait maternel amliore le dveloppement du cerveau de votre bb. Votre bb a moins de chances de dvelopper d'autres conditions, telles que l'obsit infantile, l'asthme ou diabte de type 2. pour Vous L'allaitement maternel contribue  crer un lien trs spcial entre vous et votre bb. L'allaitement maternel est pratique. Le lait maternel est toujours disponible  la bonne temprature et ne cote rien. L'allaitement maternel aide  brler des calories et vous aide  perdre le poids pris pendant la grossesse. L'allaitement rend votre contrat de l'utrus  sa taille d'avant la grossesse plus rapide et ralentit le saignement (lochies) aprs  l'accouchement. L'allaitement maternel aide  rduire votre risque de dvelopper un diabte de type 2, l'ostoporose et cancer du sein ou de l'ovaire plus tard [employer]. Signes que votre bb a faim Les premiers signes de la faim Vigilance ou [place] activit accrue. Stretching. Mouvement de la tte de gauche  droite. Mouvement de la tte et l'ouverture de la bouche lorsque le coin de la bouche ou de la joue est caress (enracinement). Augmentation sucer sons, claquements de lvres, roucoulant, en soupirant, ou grinant. Main--bouche mouvements. Augmentation de la succion des [employer] mains. Fin des signes de [address]. Pleurer intermittent. Extreme signes de faim Les signes de la faim extrme exigeront calmant et consolante avant que votre bb sera en mesure d'allaiter avec succs. Ne attendez pas que les signes de la faim extrme suivants  se produire avant que vous lancez l'allaitement maternel: Agitation. A, forte cri. Hurlant. BASICS ALLAITEMENT initiation  l'allaitement Trouvez un endroit confortable pour se asseoir ou de Education administrator, Technical brewer cou et [employer] bien appuy. Placez un oreiller ou une couverture roule sous votre bb pour [employer] apporter au niveau de votre poitrine (si vous tes assis). Coussin d'allaitement sont spcialement conus pour aider  soutenir vos bras et votre bb pendant que vous allaitez. Assurez-vous que le ventre de votre bb est confronte votre abdomen. Massez doucement votre sein. Avec vos doigts, massage de votre paroi thoracique vers le mamelon dans un mouvement circulaire. Ceci favorise l'coulement du lait. Vous devrez peut-tre poursuivre cette action lors de l'alimentation si votre lait se coule lentement. Soutenez votre Event organiser 4 doigts en dessous et votre pouce au dessus de votre mamelon. Assurez-vous que vos doigts [employer]  loin de votre mamelon et la bouche de votre bb. Stroke les lvres de votre bb doucement avec votre  doigt ou du mamelon. Lorsque la bouche de votre bb est assez grande [place], mettre rapidement votre bb  votre sein, plaant l'ensemble de votre mamelon et autant de la zone [employer] autour du mamelon (l'arole) que possible dans la bouche de votre bb. Plus arole doit tre visible dessus de la lvre suprieure de votre bb que sous la lvre infrieure. La langue de votre bb devrait tre Eusebio Me son gencive infrieure et votre sein. Assurez-vous que la bouche de votre bb est correctement positionn autour du mamelon (verrouill). Les lvres de votre bb devraient crer un sceau sur ton sein et se est avr tre (verse). Il est courant pour votre bb de sucer environ 2 3 minutes pour [employer] flux de lait maternel. verrouillage Enseigner  votre bb comment prendre le sein  votre sein correctement est trs important. Un verrouillage incorrect peut provoquer des [employer] mamelon et une diminution de la production de lait pour vous et faible gain de [employer] votre bb. Aussi, si votre bb ne est pas verrouill sur Development worker, international aid, il ou elle peut avaler un peu d'air lors de l'alimentation. Cela peut rendre votre bb difficile. Rots votre bb lorsque vous passez seins pendant l'alimentation peut aider  se dbarrasser de Brewing technologist. Cependant, l'enseignement de votre bb prenne bien le sein est toujours la meilleure faon de prvenir l'irritabilit du avaler de Media planner. Signes que votre bb a verrouills avec succs  votre mamelon: Tiraillement silencieux ou sucer silencieuse, sans vous causer de Armed forces logistics/support/administrative officer. Dglutition entendu entre tous les 3 4 suce. Mouvement musculaire dessus et en avant de ses oreilles tout en suant. Signes que votre bb n'a pas verrouilles succs au mamelon: Sucer sons ou claquer les sons de votre bb Chief Financial Officer. La douleur mamelon. Si vous pensez que votre bb n'a pas Tourist information centre manager, Art gallery manager coin de la bouche de votre bb pour briser la succion et [employer] gencives de votre bb. Tobi Bastos  nouveau initiation  l'allaitement. Les signes de succs de l'allaitement Signes de votre bb: Une diminution progressive du nombre de suce ou la cessation complte de IT consultant. Se endormir. Relaxation de son corps. Maintien d'une petite quantit de lait dans sa bouche. Lcher de votre poitrine par lui-mme ou elle-mme. Signes de vous: Seins [employer] augment la fermet, [employer], la taille et une 3 heures Environmental education officer. Seins qui sont plus doux Counselling psychologist. Le volume de lait accrue, ainsi qu'une modification de la [employer] lait et de Educational psychologist par le 5me jour de Statistician. Mamelons qui ne sont pas mal, fissures, ou des saignements. Les signes que votre bb reoit assez de lait Mouillant au moins trois couches dans une priode de 24 heures. L'urine doit tre clair et jaune ple par ge 5 jours. Au moins 3 selles dans une priode de 24 heures par ge 5 jours. Le tabouret doit tre souple et jaune. Au moins 3 selles dans une priode de 24 heures par l'ge de 7 jours. Le tabouret doit tre minable et jaune. Pas de perte de poids suprieure  10% du poids de naissance [employer] 3 premiers jours d'ge. Gain de poids moyen de 4 7 oz (120 ml) de 210 par semaine aprs l'ge de quatre jours. Le gain de poids quotidien cohrente par [employer] jours, sans perte de [employer]  l'ge de deux semaines. Aprs une tte, votre bb peut cracher une petite quantit. Cette situation est commune. ALLAITEMENT frquence et la dure Une alimentation frquente vous aidera  faire plus de lait et pouvez viter les mamelons douloureux et l'engorgement des seins. Allaiter quand vous vous sentez la ncessit de rduire la plnitude de vos seins ou lorsque votre bb montre des signes de [place]. Cela se appelle l'allaitement maternel  la demande."  vitez d'introduire une sucette  votre bb pendant que vous travaillez pour tablir l'allaitement (les quatre premires 6 semaines aprs la naissance du bb). Aprs ce temps, vous pouvez choisir d'utiliser [employer]. La recherche a montr que l'utilisation de la sucette pendant la premire anne de la vie d'un bb diminue le risque de syndrome de mort subite du nourrisson [facility]). Laissez votre bb de se nourrir de chaque sein aussi longtemps qu'il ou elle veut. Allaiter jusqu' ce que votre bb est termin alimentation. Lorsque votre bb se endort ou se dverrouille tout en se nourrissant de la premire sein, offrir l'autre sein. Parce que les nouveau-ns sont souvent endormi dans les premires semaines de vie, vous devrez peut-tre rveiller votre bb pour [employer] se nourrir. Fois allaitantes varient d'un bb . Cependant, les rgles suivantes peuvent servir de guide pour [employer] aider  vous assurer que votre bb est bien nourri: Les nouveau-ns (bbs quatre semaines d'ge ou plus jeune) peuvent allaiter toutes les 3 heures 1. Les nouveau-ns ne devraient pas aller plus de 3 heures pendant la journe ou 5 heures au cours de la nuit sans allaitement. Vous devriez Passenger transport manager bb un minimum de 8 fois dans une priode de 24 heures jusqu' ce que vous commencez  introduire des aliments solides  votre bb  environ six mois d'ge. LAIT MATERNEL POMPAGE Pompage et stockage du lait maternel permet de vous assurer que votre bb est exclusivement nourri le lait [place], mme  des moments o vous tes incapable Catering manager. Ceci est particulirement important si vous Chartered certified accountant que vous allaitez encore ou lorsque vous n'tes pas en mesure d'tre prsent pendant les repas. Votre consultante en lactation peut vous donner des [employer] sur combien de temps il est sr pour Medical illustrator. Un tire-lait est une machine qui vous permet de pomper le lait  de votre sein dans un flacon strile. Le lait maternel pomp peut alors tre Archivist. Certaines pompes mammaires sont actionns  la main, tandis que d'autres utilisent l'lectricit. Demandez  votre consultante en lactation quel type qui fonctionnera le mieux pour vous. Pompes mammaires peuvent tre achets, mais certains hpitaux et des groupes de soutien  l'allaitement louent pompes du sein sur une base [place]. Une consultante en lactation peut vous apprendre  remettre le lait expresse du sein, si vous prfrez ne pas utiliser une pompe. Prendre soin de vos seins tout vous allaitez Mamelons peuvent devenir sches et craqueles, et douloureux Chief Financial Officer. Les Oncologist  garder vos seins hydrate et en bonne sant: Financial controller sur vos mamelons. Porter un soutien-gorge. Bien que non requis, soutiens-gorge de soins infirmiers et dbardeurs spciaux sont conus pour [employer]  vos seins pour l'allaitement sans ter toute votre soutien-gorge ou Investment banker, operational. vitez de porter Sports coach de style ou de Doctor, hospital. Air scher vos mamelons pendant 3 4 minutes aprs chaque tte. Utilisez seulement les plaquettes de coton soutien-gorge pour Materials engineer fuite. Fuite de  lait maternel entre les ttes est normal. Utilisez la lanoline sur vos mamelons aprs la tte. Lanoline aide  maintenir la barrire d'hydratation de votre peau normale. Si vous utilisez la lanoline pur que vous ne avez pas besoin de le laver avant de nourrir  nouveau votre bb. La lanoline pure ne est pas toxique pour votre bb. Vous pouvez galement la main exprimer quelques gouttes de lait maternel et Programmer, applications lait dans vos mamelons et Public librarian. Dans les premires semaines aprs l'accouchement, certaines femmes ressentent des seins trs complets  (engorgement). Engorgement peut rendre vos seins sont lourds, chaleureux et tendre Insurance underwriter. Pics de Engorgement dans trois 5 jours aprs l'accouchement. Les Ecologist l'engorgement: Compltement vider vos seins durant l'allaitement ou de pompage. Vous voudrez peut-tre commencer par appliquer chaud, la chaleur humide (dans la douche ou d'essuie-mains imbibes d'eau chaude) juste avant l'alimentation ou de pompage. Cela augmente la circulation et Equities trader lait. Si votre bb ne se vide pas compltement vos seins durant l'allaitement, la pompe de lait supplmentaire aprs qu'il ou elle est termine. Porter un soutien-gorge bien ajust (infirmiers ou Health visitor) ou un dbardeur pour [employer] deux jours pour Surveyor, quantity la production de lait. Appliquer de la glace sur vos seins, sauf si cela est trop inconfortable pour vous. Assurez-vous que votre bb prend correctement positionn et Chief Financial Officer. Si l'engorgement persiste aprs 48 heures de suivre ces recommandations, communiquez avec votre fournisseur de soins de sant ou une consultante en lactation. RECOMMANDATIONS GNRALES DE SOINS DE SANT pendant l'allaitement [place] des [employer]. Alternez entre les repas et les collations, manger trois de chaque par jour. Parce que ce que vous mangez affecte votre lait maternel, certains des aliments peut rendre votre bb plus irritable que d'habitude. vitez de manger ces aliments si vous tes sr qu'ils nuisent  votre bb. Buvez du lait, jus de fruits, et de l'eau pour [employer] (environ 10 verres par [employer]). Reposez-vous souvent, dtendez-vous, et continuez  prendre vos vitamines prnatales pour viter la fatigue, le stress et l'anmie. Continuer les vrifications d'auto-sensibilisation mammaires. viter de Psychologist, educational. vitez l'alcool et de drogues. Certains mdicaments qui peuvent tre  nocifs pour votre bb peuvent transmettre par [employer]. Il est important de demander  votre fournisseur de soins de sant avant de prendre Manufacturing systems engineer, y compris tous les over-the-counter et [employer] sur ordonnance ainsi que les supplments vitaminiques et  base de plantes. Il est possible de devenir enceinte pendant l'allaitement. Si le contrle des naissances est souhaite, demandez  votre fournisseur de soins de sant sur les options qui seront sans danger pour votre bb. Obtenir des [employer] si: Vous vous sentez comme vous voulez arrter l'allaitement ou sont devenus frustrs Pharmacist, hospital. Vous avez des seins ou des mamelons douloureux. Vos mamelons sont fissurs ou des saignements. Vos seins [employer], tendre, ou tide. Vous avez une zone enfle de chaque sein. Vous avez de la fivre ou des frissons. Vous avez des [place] ou des vomissements. Vous avez drainage autre que le lait maternel de vos mamelons. Vos seins ne deviennent pas complte avant les ttes par le 5me jour aprs l'accouchement. Vous vous sentez triste et dprim. Votre bb est trop fatigu pour [facility]. Votre bb a des troubles du sommeil. Votre bb mouille moins de trois [employer] priode de 24 heures. Votre bb a moins de 3 selles [employer] priode  de 24 heures. La peau de votre bb ou la partie blanche de ses yeux devient jaune. Votre enfant ne prend pas de poids de 5 jours d'ge. Consulter immdiatement SOINS MDICAUX SI: Votre bb est trop fatigu (lthargique) et ne veut pas se rveiller et de se nourrir. Votre bb dveloppe une fivre inexplique. Document publi: 03/12/2005 document rvis: 20/10/2012 Document de rvision: 01/29/2013 ExitCare information des patients  2014 ExitCare, [place]. L'allaitement maternel Dcider d'allaiter est l'un des [employer] choix que vous pouvez faire pour vous et votre bb. Un changement des hormones pendant la grossesse entrane votre  tissu mammaire  crotre et  augmenter le nombre et la taille de vos conduits de lait. Ces hormones permettent galement des protines, des sucres et graisses de votre alimentation en sang pour [employer] lait maternel dans vos glandes productrices de lait. Hormones empchent le lait maternel d'tre libr avant que votre bb est n ainsi que le flux de lait invite aprs la naissance. Une fois que l'allaitement maternel a commenc, les penses de votre bb, ainsi que son aspiration ou de pleurer, peut stimuler la libration de lait de vos glandes productrices de lait. Avantages de Nurse, mental health bb Votre premier lait (colostrum) permet le fonctionnement du systme digestif de votre bb mieux. Il sont des [employer] lait qui aident votre bb  combattre les infections. Votre bb a une plus faible incidence de l'asthme, les allergies et le syndrome de mort subite du nourrisson. Les nutriments contenus Secondary school teacher lait maternel sont mieux pour votre bb que les prparations pour nourrissons et sont conus uniquement pour les besoins de votre bb. Le lait maternel amliore le dveloppement du cerveau de votre bb. Votre bb a moins de chances de dvelopper d'autres conditions, telles que l'obsit infantile, l'asthme ou diabte de type 2. pour Vous L'allaitement maternel contribue  crer un lien trs spcial entre vous et votre bb. L'allaitement maternel est pratique. Le lait maternel est toujours disponible  la bonne temprature et ne cote rien. L'allaitement maternel aide  brler des calories et vous aide  perdre le poids pris pendant la grossesse. L'allaitement rend votre contrat de l'utrus  sa taille d'avant la grossesse plus rapide et ralentit le saignement (lochies) aprs l'accouchement. L'allaitement maternel aide  rduire votre risque de dvelopper un diabte de type 2, l'ostoporose et cancer du sein ou de l'ovaire plus tard [employer]. Signes que votre bb a  faim Les premiers signes de la faim Vigilance ou [place] activit accrue. Stretching. Mouvement de la tte de gauche  droite. Mouvement de la tte et l'ouverture de la bouche lorsque le coin de la bouche ou de la joue est caress (enracinement). Augmentation sucer sons, claquements de lvres, roucoulant, en soupirant, ou grinant. Main--bouche mouvements. Augmentation de la succion des [employer] mains. Fin des signes de [address]. Pleurer intermittent. Extreme signes de faim Les signes de la faim extrme exigeront calmant et consolante avant que votre bb sera en mesure d'allaiter avec succs. Ne attendez pas que les signes de la faim extrme suivants  se produire avant que vous lancez l'allaitement maternel: Agitation. A, forte cri. Hurlant. BASICS ALLAITEMENT initiation  l'allaitement Trouvez un endroit confortable pour se asseoir ou de Education administrator, Technical brewer cou et [employer] bien appuy. Placez un oreiller ou une couverture roule sous votre bb pour [employer] apporter au niveau de votre poitrine (si vous tes assis). Coussin d'allaitement sont spcialement conus pour aider  soutenir vos bras et votre bb pendant que  vous allaitez. Assurez-vous que le ventre de votre bb est confronte votre abdomen. Massez doucement votre sein. Avec vos doigts, massage de votre paroi thoracique vers le mamelon dans un mouvement circulaire. Ceci favorise l'coulement du lait. Vous devrez peut-tre poursuivre cette action lors de l'alimentation si votre lait se coule lentement. Soutenez votre Event organiser 4 doigts en dessous et votre pouce au dessus de votre mamelon. Assurez-vous que vos doigts sont bien loin de votre mamelon et la bouche de votre bb. Stroke les lvres de votre bb doucement avec votre doigt ou du mamelon. Lorsque la bouche de votre bb est assez grande [place], mettre rapidement votre bb  votre sein, plaant l'ensemble de votre mamelon et autant de la zone [employer] autour du  mamelon (l'arole) que possible dans la bouche de votre bb. Plus arole doit tre visible dessus de la lvre suprieure de votre bb que sous la lvre infrieure. La langue de votre bb devrait tre Eusebio Me son gencive infrieure et votre sein. Assurez-vous que la bouche de votre bb est correctement positionn autour du mamelon (verrouill). Les lvres de votre bb devraient crer un sceau sur ton sein et se est avr tre (verse). Il est courant pour votre bb de sucer environ 2 3 minutes pour [employer] flux de lait maternel. verrouillage Enseigner  votre bb comment prendre le sein  votre sein correctement est trs important. Un verrouillage incorrect peut provoquer des [employer] mamelon et une diminution de la production de lait pour vous et faible gain de [employer] votre bb. Aussi, si votre bb ne est pas verrouill sur Development worker, international aid, il ou elle peut avaler un peu d'air lors de l'alimentation. Cela peut rendre votre bb difficile. Rots votre bb lorsque vous passez seins pendant l'alimentation peut aider  se dbarrasser de Brewing technologist. Cependant, l'enseignement de votre bb prenne bien le sein est toujours la meilleure faon de prvenir l'irritabilit du avaler de Media planner. Signes que votre bb a verrouills avec succs  votre mamelon: Tiraillement silencieux ou sucer silencieuse, sans vous causer de Armed forces logistics/support/administrative officer. Dglutition entendu entre tous les 3 4 suce. Mouvement musculaire dessus et en avant de ses oreilles tout en suant. Signes que votre bb n'a pas verrouilles succs au mamelon: Sucer sons ou claquer les sons de votre bb Chief Financial Officer. La douleur mamelon. Si vous pensez que votre bb n'a pas Tourist information centre manager, Sales executive coin de la bouche de votre bb pour briser la succion et [employer] gencives de votre bb. Tobi Bastos  nouveau initiation  l'allaitement. Les signes de succs de  l'allaitement Signes de votre bb: Une diminution progressive du nombre de suce ou la cessation complte de IT consultant. Se endormir. Relaxation de son corps. Maintien d'une petite quantit de lait dans sa bouche. Lcher de votre poitrine par lui-mme ou elle-mme. Signes de vous: Seins [employer] augment la fermet, [employer], la taille et une 3 heures Environmental education officer. Seins qui sont plus doux Counselling psychologist. Le volume de lait accrue, ainsi qu'une modification de la [employer] lait et de Educational psychologist par le 5me jour de Statistician. Mamelons qui ne sont pas mal, fissures, ou des saignements. Les signes que votre bb reoit assez de lait Mouillant au moins trois couches dans une priode de 24 heures. L'urine doit tre clair et jaune ple par ge 5 jours. Au moins 3 selles dans une priode de 24 heures par ge 5 jours. Le tabouret doit tre souple et jaune.  Au moins 3 selles dans une priode de 24 heures par l'ge de 7 jours. Le tabouret doit tre minable et jaune. Pas de perte de poids suprieure  10% du poids de naissance [employer] 3 premiers jours d'ge. Gain de poids moyen de 4 7 oz (120 ml) de 210 par semaine aprs l'ge de quatre jours. Le gain de poids quotidien cohrente par ge cinq jours, sans perte de poids aprs l'ge de deux semaines. Aprs une tte, votre bb peut cracher une petite quantit. Cette situation est commune. ALLAITEMENT frquence et la dure Une alimentation frquente vous aidera  faire plus de lait et pouvez viter les mamelons douloureux et l'engorgement des seins. Allaiter quand vous vous sentez la ncessit de rduire la plnitude de vos seins ou lorsque votre bb montre des signes de [place]. Cela se appelle l'allaitement maternel  la demande." vitez d'introduire une sucette  votre bb pendant que vous travaillez pour tablir l'allaitement (les quatre premires 6 semaines aprs la naissance du bb). Aprs ce temps, vous  pouvez choisir d'utiliser [employer]. La recherche a montr que l'utilisation de la sucette pendant la premire anne de la vie d'un bb diminue le risque de syndrome de mort subite du nourrisson [facility]). Laissez votre bb de se nourrir de chaque sein aussi longtemps qu'il ou elle veut. Allaiter jusqu' ce que votre bb est termin alimentation. Lorsque votre bb se endort ou se dverrouille tout en se nourrissant de la premire sein, offrir l'autre sein. Parce que les nouveau-ns sont souvent endormi dans les premires semaines de vie, vous devrez peut-tre rveiller votre bb pour [employer] se nourrir. Fois allaitantes varient d'un bb . Cependant, les rgles suivantes peuvent servir de guide pour [employer] aider  vous assurer que votre bb est bien nourri: Les nouveau-ns (bbs quatre semaines d'ge ou plus jeune) peuvent allaiter toutes les 3 heures 1. Les nouveau-ns ne devraient pas aller plus de 3 heures pendant la journe ou 5 heures au cours de la nuit sans allaitement. Vous devriez Passenger transport manager bb un minimum de 8 fois dans une priode de 24 heures jusqu' ce que vous commencez  introduire des aliments solides  votre bb  environ six mois d'ge. LAIT MATERNEL POMPAGE Pompage et stockage du lait maternel permet de vous assurer que votre bb est exclusivement nourri le lait [place], mme  des moments o vous tes incapable Catering manager. Ceci est particulirement important si vous Chartered certified accountant que vous allaitez encore ou lorsque vous n'tes pas en mesure d'tre prsent pendant les repas. Votre consultante en lactation peut vous donner des [employer]ys [employer] Us sur combien de temps il est sr pour Medical illustrator. Un tire-lait est une machine qui vous permet de pomper le lait de votre sein dans un flacon strile. Le lait maternel pomp peut alors tre Archivist. Certaines pompes mammaires sont actionns  la main, tandis  que d'autres utilisent l'lectricit. Demandez  votre consultante en lactation quel type qui fonctionnera le mieux pour vous. Pompes mammaires peuvent tre achets, mais certains hpitaux et des groupes de soutien  l'allaitement louent pompes du sein sur une base [place]. Une consultante en lactation peut vous apprendre  remettre le lait expresse du sein, si vous prfrez ne pas utiliser une pompe. Prendre soin de vos seins tout vous allaitez [place] peuvent devenir sches et craqueles, et douloureux Chief Financial Officer. Les Oncologist  garder vos seins hydrate et en bonne sant: Production designer, theatre/television/film  vos mamelons. Porter un soutien-gorge. Bien que non requis, soutiens-gorge de soins infirmiers et dbardeurs spciaux sont conus pour [employer]  vos seins pour l'allaitement sans ter toute votre soutien-gorge ou Investment banker, operational. vitez de porter Sports coach de style ou de Doctor, hospital. Air scher vos mamelons pendant 3 4 minutes aprs chaque tte. Utilisez seulement les plaquettes de coton soutien-gorge pour Materials engineer fuite. Fuite de lait maternel entre les ttes est normal. Utilisez la lanoline sur vos mamelons aprs la tte. Lanoline aide  maintenir la barrire d'hydratation de votre peau normale. Si vous utilisez la lanoline pur que vous ne avez pas besoin de le laver avant de nourrir  nouveau votre bb. La lanoline pure ne est pas toxique pour votre bb. Vous pouvez galement la main exprimer quelques gouttes de lait maternel et Programmer, applications lait dans vos mamelons et Public librarian. Dans les premires semaines aprs l'accouchement, certaines femmes ressentent des seins trs complets (engorgement). Engorgement peut rendre vos seins sont lourds, chaleureux et tendre Insurance underwriter. Pics de Engorgement dans trois 5 jours aprs l'accouchement. Les Equities trader l'engorgement: Compltement vider vos seins durant l'allaitement ou de pompage. Vous voudrez peut-tre commencer par appliquer chaud, la chaleur humide (dans la douche ou d'essuie-mains imbibes d'eau chaude) juste avant l'alimentation ou de pompage. Cela augmente la circulation et Equities trader lait. Si votre bb ne se vide pas compltement vos seins durant l'allaitement, la pompe de lait supplmentaire aprs qu'il ou elle est termine. Porter un soutien-gorge bien ajust (infirmiers ou Health visitor) ou un dbardeur pour [employer] deux jours pour Surveyor, quantity la production de lait. Appliquer de la glace sur vos seins, sauf si cela est trop inconfortable pour vous. Assurez-vous que votre bb prend correctement positionn et Chief Financial Officer. Si l'engorgement persiste aprs 48 heures de suivre ces recommandations, communiquez avec votre fournisseur de soins de sant ou une consultante en lactation. RECOMMANDATIONS GNRALES DE SOINS DE SANT pendant l'allaitement [place] des [employer]. Alternez entre les repas et les collations, manger trois de chaque par jour. Parce que ce que vous mangez affecte votre lait maternel, certains des aliments peut rendre votre bb plus irritable que d'habitude. vitez de manger ces aliments si vous tes sr qu'ils nuisent  votre bb. Buvez du lait, jus de fruits, et de l'eau pour Alcoa Inc (environ 10 verres par [employer]). Reposez-vous souvent, dtendez-vous, et continuez  prendre vos vitamines prnatales pour viter la fatigue, le stress et l'anmie. Continuer les vrifications d'auto-sensibilisation mammaires. viter de Psychologist, educational. vitez l'alcool et de drogues. Certains mdicaments qui peuvent tre nocifs pour votre bb peuvent transmettre par [employer]. Il est important de demander  votre fournisseur de soins de sant avant de prendre Manufacturing systems engineer, y compris tous les  over-the-counter et [employer]OK sur ordonnance ainsi que les supplments vitaminiques et  base de plantes. Il est possible de devenir enceinte pendant l'allaitement. Si le contrle des naissances est souhaite, demandez  votre fournisseur de soins de sant sur les options qui seront sans danger pour votre bb. Obtenir des Land [employer]Lakes si: Vous vous sentez comme vous voulez arrter l'allaitement ou sont devenus frustrs Pharmacist, hospital. Vous avez des seins ou des mamelons douloureux. Vos mamelons sont fissurs ou des saignements. Vos seins [employer], tendre, ou tide. Vous avez une zone enfle de chaque sein. Vous avez de la fivre ou des frissons. Vous [employer]  nauses ou des vomissements. Vous avez drainage autre que le lait maternel de vos mamelons. Vos seins ne deviennent pas complte avant les ttes par le 5me jour aprs l'accouchement. Vous vous sentez triste et dprim. Votre bb est trop fatigu pour [facility]. Votre bb a des troubles du sommeil. Votre bb mouille moins de trois [employer] priode de 24 heures. Votre bb a moins de 3 selles dans une priode de 24 heures. La peau de votre bb ou la partie blanche de ses yeux devient jaune. Votre enfant ne prend pas de poids de 5 jours d'ge. Consulter immdiatement SOINS MDICAUX SI: Votre bb est trop fatigu (lthargique) et ne veut pas se rveiller et de se nourrir. Votre bb dveloppe une fivre inexplique. Document publi: 03/12/2005 document rvis: 20/10/2012 Document de rvision: 01/29/2013 ExitCare information des patients  2014 [place], [place].   Livraison vaginale Pendant l'accouchement, votre fournisseur de soins de sant vous aidera  donner naissance  votre bb. Lors Education officer, museum par voie vaginale, vous allez travailler pour [employer] Brands bb de votre vagin. Cependant, avant que vous pouvez pousser votre bb, quelques choses doivent se produire. L'ouverture de l'utrus (col) a pour adoucir,  Herbalist, et [employer] (Education officer, community) tout le chemin  10 cm. En outre, votre bb doit se dplacer vers le bas de l'utrus dans le vagin. SIGNES DU TRAVAIL Votre fournisseur de soins de sant devra d'abord se assurer que vous tes Ryder System. Signes de travail comprennent: En passant ce qu'on appelle le bouchon muqueux avant le dbut du [place]. Ce est une petite quantit de mucus tachs de sang. Ayant rguliers, contractions utrines douloureuses. Le temps entre les contractions se raccourcit. L'inconfort et Liz Claiborne se progressivement plus intense. Douleurs de contraction se aggraver lors de la marche et ne disparaissent pas au repos. Votre col devient plus mince (effacement) et se dilate. AVANT LA LIVRAISON Une fois que vous tes dans le travail et admis [employer] centre [place] ou de [place], votre fournisseur de soins de sant peut faire ce qui suit: Advertising copywriter un examen physique complet. Examiner toutes les complications lies  la Probation officer. Vrifiez votre pression artrielle, le pouls, la temprature et la frquence cardiaque (signes vitaux). Dterminer si, et lorsque, la rupture des membranes amniotiques se est produite. Faites un examen vaginal (en utilisant un gant strile et lubrifiant) pour dterminer: La position (prsentation) du bb. Est la tte du bb prsente premier (sommet) Secondary school teacher canal de naissance (vagin), ou sont les pieds ou les fesses premier (sige)? Le niveau (station) de la tte du bb [employer] canal de [place]. L'effacement et la dilatation du col de l'utrus. Un moniteur ftal lectronique est habituellement plac sur votre abdomen lorsque vous arrivez d'abord. Ce est utilis pour surveiller vos contractions et le rythme cardiaque du bb. Lorsque le AES Corporation est sur votre abdomen (moniteur ftal externe), il ne peut ramasser la frquence et la dure de vos contractions. Il ne peut pas dire la force de vos contractions. Se il devient  ncessaire pour votre fournisseur de soins de sant pour savoir exactement comment la force de vos contractions sont ou pour voir exactement ce que le rythme cardiaque du bb est en train de Product/process development scientist, un moniteur interne peut tre insr dans le vagin et l'utrus. Votre fournisseur de Conservator, museum/gallery de sant discutera des avantages et des risques de l'utilisation d'un moniteur interne et d'obtenir votre permission Personal assistant dispositif. Surveillance continue du ftus peut tre ncessaire si vous avez une pridurale, reoivent certains  mdicaments (tels que l'ocytocine), ou ont grossesse ou le travail complications. Un tube d'accs au IV peut tre plac dans une veine de votre bras pour dlivrer des fluides et des mdicaments si [place]. Trois tapes de travail et In[employer] Inc normal et la livraison est divis en trois tapes. premire tape Cette tape commence lorsque vous commencez  contracter rgulirement et col de l'utrus commence  se effacer et de Psychologist, forensic. Il se termine lorsque le col est compltement ouvert (compltement dilat). La premire tape est la plus longue tape du travail et peut durer de 3 heures  15 heures. Plusieurs mthodes sont disponibles pour aider  la World [employer] Services Corporation. Vous et votre fournisseur de Conservator, museum/gallery de sant y IT trainer l'option qui vous Physicist, medical. Les options comprennent: Mdicaments opiodes. Ce sont des Countrywide Financial contre la douleur que vous pouvez obtenir auprs de votre tube IV ou comme un coup de feu dans le muscle. Ces mdicaments diminuent la douleur, mais ne font pas disparatre compltement. Pridurale. Un mdicament est administr  travers un mince tube Nucor Corporation est insr Public Service Enterprise Group. Le mdicament engourdit la partie infrieure de Fifth [employer] Banc[employer] et empche toute Yahoo! Inc cette rgion. Mdecine de Nurse, children's. Il se agit d'une injection d'un anesthsique de chaque ct du col de l'utrus. Vous pouvez Passenger transport manager, qui ne implique pas l'utilisation de mdicaments contre la douleur ou une pridurale pendant le travail et Architectural technologist. Au lieu de cela, vous allez Allstate, comme des exercices de respiration, pour Naval architect. Deuxime tape Le deuxime stade du travail commence lorsque votre col est compltement dilat  10 cm. Il continue jusqu' ce que vous poussez votre bb  travers le canal de naissance et le bb est n. Cette tape peut prendre quelques minutes ou plusieurs heures. L'emplacement de la tte de votre bb comme il se dplace  travers le canal de naissance est signal comme un numro appel une station. Si la tte du bb n'a pas commenc sa descente, la station est dcrit comme tant au moins trois (3). Lorsque la tte de votre bb est  la station de zro, ce est au milieu du canal de naissance et est Occidental Petroleum bassin. La station de votre bb permet d'indiquer l'tat d'avancement de la seconde phase du [place]. Lorsque votre bb est n, votre fournisseur de Conservator, museum/gallery de sant peut tenir le bb avec sa tte baisse pour Dillard's fluide amniotique, le mucus, et le sang de pntrer Performance Food Group bb. La bouche et USAA bb peuvent tre aspirs avec une petite poire pour Hess Corporation tout liquide supplmentaire. Votre fournisseur de Conservator, museum/gallery de sant Chartered loss adjuster bb sur le ventre. Il est important de garder le bb d'avoir froid. Pour ce faire, le fournisseur de soins de sant Risk manager bb teint, Systems developer bb directement sur votre peau (avec pas de couvertures entre vous et le bb), et couvrir le bb [place] des couvertures chaudes et sches. Le cordon ombilical est coup. Troisime tape Au cours de la troisime phase Engineer, drilling, votre fournisseur de soins de sant sera le placenta (placenta) et vous assurer que votre saignement est sous contrle. La dlivrance du placenta prend habituellement environ 5 minutes, mais  peut prendre jusqu' 30 minutes. Aprs la dlivrance du placenta, un mdicament peut tre Nurse, adult par injection IV ou pour Educational psychologist l'utrus et Unisys Corporation. Si vous envisagez d'allaiter, vous pouvez essayer de  le [employer] Worldwide. Aprs vous livrez le placenta, l'utrus doit contracter et obtenir trs ferme. Si votre utrus ne reste pas ferme, votre fournisseur de soins de sant sera Physicist, medical. Ce est important parce que la contraction de l'utrus aide coupe saignement au site o le placenta a t attach  votre utrus. Si votre utrus ne se contracte pas correctement et rester ferme, vous pouvez continuer  saigner abondamment. Se il ya beaucoup de saignement, les mdicaments peuvent tre donns pour Amgen Inc l'utrus et arrter [facility]. Document publi: 26.09.2009 document rvis: 20/10/2012 Document de rvision: 10/29/2012 ExitCare information des patients  2014 [place], [place].

## 2013-03-23 NOTE — Progress Notes (Signed)
P = 92 

## 2013-03-23 NOTE — Progress Notes (Signed)
Patient ID: Casey Gonzalez, female   DOB: 1986/02/13, 27 y.o.   MRN: [id number] S. Comfortable without epidural at this point. O. VSS, AF      FHR- category 1      CVX- 7/99/+1  A/P. IOL for oligo, TOLAC- progressing well Expectant management

## 2013-03-23 NOTE — Progress Notes (Addendum)
Casey Gonzalez is a 27 y.o. G2P1001 at [redacted]w[redacted]d admitted for induction of labor due to oligohydramnios.  Subjective: Pt s/p FB, no complaints at this time  Objective: BP 125/74  Pulse 101  Temp(Src) 98.1 F (36.7 C) (Oral)  Resp 16  Ht 5\' 7"  (1.702 m)  Wt 108.41 kg (239 lb)  BMI 37.42 kg/m2      FHT:  FHR: 140s bpm, variability: minimal ,  accelerations:  Present,  decelerations:  Absent UC:   irregular, every 3-59min minutes SVE:   Dilation: 3 Effacement (%): 80 Station: -1 Exam by:: Dr. Ike Bene  Labs: Lab Results  Component Value Date   WBC 13.1* 03/23/2013   HGB 11.9* 03/23/2013   HCT 37.1 03/23/2013   MCV 89.0 03/23/2013   PLT 286 03/23/2013    Assessment / Plan: Induction of labor due to oligohydramnios,  start on pitocin  Labor: FB now out, will start on pit, AROM Preeclampsia:  no signs or symptoms of toxicity Fetal Wellbeing:  Category II will give fluid bolus. Pain Control:  Epidural and Fentanyl I/D:  GBS + on PCN Anticipated MOD:  NSVD  Osmar Howton RYAN 03/23/2013, 4:51 PM

## 2013-03-24 ENCOUNTER — Encounter (HOSPITAL_COMMUNITY): Payer: Self-pay | Admitting: General Practice

## 2013-03-24 DIAGNOSIS — O34219 Maternal care for unspecified type scar from previous cesarean delivery: Secondary | ICD-10-CM

## 2013-03-24 DIAGNOSIS — O4100X0 Oligohydramnios, unspecified trimester, not applicable or unspecified: Secondary | ICD-10-CM

## 2013-03-24 DIAGNOSIS — O9989 Other specified diseases and conditions complicating pregnancy, childbirth and the puerperium: Secondary | ICD-10-CM

## 2013-03-24 LAB — CBC
HCT: 28.2 % — ABNORMAL LOW (ref 36.0–46.0)
Hemoglobin: 9.1 g/dL — ABNORMAL LOW (ref 12.0–15.0)
MCH: 28.6 pg (ref 26.0–34.0)
MCHC: 32.3 g/dL (ref 30.0–36.0)
MCV: 88.7 fL (ref 78.0–100.0)

## 2013-03-24 MED ORDER — ONDANSETRON HCL 4 MG PO TABS: 4.0000 mg | ORAL_TABLET | ORAL | Status: DC | PRN

## 2013-03-24 MED ORDER — DIPHENHYDRAMINE HCL 25 MG PO CAPS: 25.0000 mg | ORAL_CAPSULE | Freq: Four times a day (QID) | ORAL | Status: DC | PRN

## 2013-03-24 MED ORDER — LANOLIN HYDROUS EX OINT: 1.0000 "application " | TOPICAL_OINTMENT | CUTANEOUS | Status: DC | PRN

## 2013-03-24 MED ORDER — WITCH HAZEL-GLYCERIN EX PADS: 1.0000 "application " | MEDICATED_PAD | CUTANEOUS | Status: DC | PRN

## 2013-03-24 MED ORDER — FERROUS SULFATE 325 (65 FE) MG PO TABS
325.0000 mg | ORAL_TABLET | Freq: Two times a day (BID) | ORAL | Status: DC
  Administered 2013-03-24 – 2013-03-26 (×5): 325 mg via ORAL
  Filled 2013-03-24 (×5): qty 1

## 2013-03-24 MED ORDER — SENNOSIDES-DOCUSATE SODIUM 8.6-50 MG PO TABS
2.0000 | ORAL_TABLET | ORAL | Status: DC
  Administered 2013-03-25 – 2013-03-26 (×2): 2 via ORAL
  Filled 2013-03-24 (×2): qty 2

## 2013-03-24 MED ORDER — IBUPROFEN 600 MG PO TABS
600.0000 mg | ORAL_TABLET | Freq: Four times a day (QID) | ORAL | Status: DC
  Administered 2013-03-24 – 2013-03-26 (×10): 600 mg via ORAL
  Filled 2013-03-24 (×9): qty 1

## 2013-03-24 MED ORDER — ONDANSETRON HCL 4 MG/2ML IJ SOLN: 4.0000 mg | INTRAMUSCULAR | Status: DC | PRN

## 2013-03-24 MED ORDER — BENZOCAINE-MENTHOL 20-0.5 % EX AERO
1.0000 "application " | INHALATION_SPRAY | CUTANEOUS | Status: DC | PRN
  Administered 2013-03-24: 1 via TOPICAL
  Filled 2013-03-24: qty 56

## 2013-03-24 MED ORDER — SIMETHICONE 80 MG PO CHEW: 80.0000 mg | CHEWABLE_TABLET | ORAL | Status: DC | PRN

## 2013-03-24 MED ORDER — DIBUCAINE 1 % RE OINT: 1.0000 "application " | TOPICAL_OINTMENT | RECTAL | Status: DC | PRN

## 2013-03-24 MED ORDER — MAGNESIUM HYDROXIDE 400 MG/5ML PO SUSP: 30.0000 mL | ORAL | Status: DC | PRN

## 2013-03-24 MED ORDER — OXYCODONE-ACETAMINOPHEN 5-325 MG PO TABS
1.0000 | ORAL_TABLET | ORAL | Status: DC | PRN
  Administered 2013-03-24: 1 via ORAL
  Filled 2013-03-24: qty 1

## 2013-03-24 MED ORDER — ZOLPIDEM TARTRATE 5 MG PO TABS: 5.0000 mg | ORAL_TABLET | Freq: Every evening | ORAL | Status: DC | PRN

## 2013-03-24 MED ORDER — TETANUS-DIPHTH-ACELL PERTUSSIS 5-2.5-18.5 LF-MCG/0.5 IM SUSP: 0.5000 mL | Freq: Once | INTRAMUSCULAR | Status: DC

## 2013-03-24 MED ORDER — MEASLES, MUMPS & RUBELLA VAC ~~LOC~~ INJ
0.5000 mL | INJECTION | Freq: Once | SUBCUTANEOUS | Status: DC
  Filled 2013-03-24: qty 0.5

## 2013-03-24 MED ORDER — PRENATAL MULTIVITAMIN CH
1.0000 | ORAL_TABLET | Freq: Every day | ORAL | Status: DC
  Administered 2013-03-24 – 2013-03-26 (×3): 1 via ORAL
  Filled 2013-03-24 (×3): qty 1

## 2013-03-24 NOTE — Progress Notes (Signed)
UR chart review completed.  

## 2013-03-24 NOTE — Lactation Note (Signed)
This note was copied from the chart of Casey Xochitl Touraoua-Djibril. Lactation Consultation Note  Patient Name: Casey Gonzalez FAOZH'Y Date: 03/24/2013 Reason for consult: Follow-up assessment;Difficult latch;Infant < 6lbs Mom is having difficulty latching baby, called for assist. Mom has large breasts, nipples are erect but with short nipple shaft, flatten with breast compression.  Mom was attempting to latch baby in cradle hold but baby was not able to obtain any depth. Changed to football then tried cross cradle, Mom could not get baby latched. Tried #20/24 nipple shield but nipple shield did not stay on. Changed back to cradle hold, worked with Mom to support her breast, support baby. After few attempts and sandwiching nipple by LC baby latched well and sustained the latch for 10 minutes. Mom then was able to latch baby to left breast in cradle hold with minimal assist. Set up DEBP and encouraged Mom to post pump to encourage milk production due to baby not latching consistently and having low blood sugars. Demonstrated how to use on Preemie setting/cleaning/milk storage reviewed. Encouraged Mom to BF with each feeding. Ask for assist with latching baby, call [facility] for breast pump at d/c.   Maternal Data Formula Feeding for Exclusion: Yes Reason for exclusion: Mother's choice to formula and breast feed on admission Infant to breast within first hour of birth: Yes Has patient been taught Hand Expression?: Yes Does the patient have breastfeeding experience prior to this delivery?: Yes  Feeding Feeding Type: Breast Fed Nipple Type: Slow - flow Length of feed: 20 min  LATCH Score/Interventions Latch: Repeated attempts needed to sustain latch, nipple held in mouth throughout feeding, stimulation needed to elicit sucking reflex. Intervention(s): Adjust position;Assist with latch;Breast massage;Breast compression  Audible Swallowing: A few with stimulation  Type of Nipple: Everted  at rest and after stimulation (flatten w/breast comp, short nipple shaft)  Comfort (Breast/Nipple): Soft / non-tender     Hold (Positioning): Assistance needed to correctly position infant at breast and maintain latch. Intervention(s): Breastfeeding basics reviewed;Support Pillows;Position options;Skin to skin  LATCH Score: 7  Lactation Tools Discussed/Used Tools: Nipple Dorris Carnes;Pump Nipple shield size: 20;24 Breast pump type: Double-Electric Breast Pump WIC Program: Yes Pump Review: Setup, frequency, and cleaning;Milk Storage Initiated by:: KG Date initiated:: 03/24/13   Consult Status Consult Status: Follow-up Date: 03/25/13 Follow-up type: In-patient    Alfred Levins 03/24/2013, 2:44 PM

## 2013-03-24 NOTE — Lactation Note (Signed)
This note was copied from the chart of Casey Gonzalez. Lactation Consultation Note  Patient Name: Casey Gonzalez ZOXWR'U Date: 03/24/2013 Reason for consult: Initial assessment;Infant < 6lbs Baby is in the nursery at this visit. Mom reports baby is not latching well. Baby has been supplemented due to this and low blood sugars. BF Basics reviewed with Mom. Mom plans to breast and bottle feed. Encouraged to BF each feeding to encourage milk production. Guidelines for supplementing with BF reviewed with Mom. Lactation brochure left for review. Advised of OP services and support group. Encouraged Mom to call [facility] with next feeding for assist. Mom's aunt was present to assist with interpreting.   Maternal Data Formula Feeding for Exclusion: Yes Reason for exclusion: Mother's choice to formula and breast feed on admission Infant to breast within first hour of birth: Yes Has patient been taught Hand Expression?: Yes Does the patient have breastfeeding experience prior to this delivery?: Yes  Feeding Feeding Type: Bottle Fed - Formula Nipple Type: Slow - flow  LATCH Score/Interventions                      Lactation Tools Discussed/Used WIC Program: Yes   Consult Status Consult Status: Follow-up Date: 03/24/13 Follow-up type: In-patient    Casey Gonzalez 03/24/2013, 11:16 AM

## 2013-03-24 NOTE — Progress Notes (Signed)
Clinical Social Work Department PSYCHOSOCIAL ASSESSMENT - MATERNAL/CHILD 03/24/2013  Patient:  Casey Gonzalez,Casey Gonzalez  Account Number:  [id number]  Admit Date:  03/23/2013  Childs Name:   Imane-Soraya    Clinical Social Worker:  Oliviya Gilkison, LCSW   Date/Time:  03/24/2013 12:00 N  Date Referred:  03/24/2013   Referral source  Physician     Referred reason  LPNC   Other referral source:    I:  FAMILY / HOME ENVIRONMENT Child's legal guardian:  PARENT  Guardian - Name Guardian - Age Guardian - Address  Saul Casey Gonzalez [address] [place], [place] [address]   Other household support members/support persons Name Relationship DOB  Adamou Maimouna AUNT   2.5 year old dtr DAUGHTER   4 year old NIECE    Other support:    II  PSYCHOSOCIAL DATA Information Source:  Patient Interview  Financial and Community Resources Employment:   Patient is unemployed   Financial resources:  Self Pay If Medicaid - County:   Other  WIC  Other - See comment   School / Grade:   Maternity Care Coordinator / Child Services Coordination / Early Interventions:   MOB is involved with Adopt-a-Mom program. MOB reports she received prenatal care via [facility].  Cultural issues impacting care:   MOB recently moved from [place] to [place] to live with aunt about 6 months ago.    III  STRENGTHS Strengths  Home prepared for Child (including basic supplies)  Supportive family/friends  Adequate Resources   Strength comment:  MOB receives WIC and involved with Adopt-a-Mom program. MOB reports that aunt is supportive and can assist as needed. MOB reports that baby already has crib, carseat, diapers, and clothes.   IV  RISK FACTORS AND CURRENT PROBLEMS Current Problem:  YES   Risk Factor & Current Problem Patient Issue Family Issue Risk Factor / Current Problem Comment  Financial Resources Y Y Patient recently moved from [place] and reports limited fiancial resources    V   SOCIAL WORK ASSESSMENT CSW met with MOB and aunt in room while baby was in nursery. CSW introduced myself and explained role.    MOB reports she lived in [place] but recently moved with her 2.25 year old dtr in order to live with aunt. MOB reports that FOB is not involved with baby but that aunt will be supportive and will assist as needed. Aunt has a 4 year old dtr who is no longer sleeping in her crib which baby can use. MOB reports that she and aunt have been buying supplies in order to prepare for baby but that finances are tight due to recent move.    MOB is not employed and plans to stay at home with 2.5 dtr and baby. MOB reports that she will contact WIC and Adopt-A-Mom regarding baby's arrival. MOB reports that she feels prepared to care for baby since she already has another dtr.  MOB requested referral to [facility] in order to receive furniture assistance.    CSW and MOB discussed LPNC and MOB reports that she was traveling early on in pregnancy and then she had to be set up with clinic in order to receive care. CSW explained policy of drug screen due to LPNC and MOB very understanding. MOB denies any drug or alcohol use during pregnancy. MOB confirmed address and is aware of CPS report that will need to be made if there is a positive drug screen.    MOB thanked CSW for   time and reports no further concerns at this time. CSW will follow up on drug screen results.      VI SOCIAL WORK PLAN Social Work Plan  Information/Referral to Community Resources   Type of pt/family education:   Patient to follow up with WIC and Adopt-a-Mom regarding baby's arrival.   If child protective services report - county:   If child protective services report - date:   Information/referral to community resources comment:   CSW provided referral to [facility].   Other social work plan:   CSW will follow up with drug screen results and will contact CPS if positive screen.    Claudia Greenley, LCSW (Coverage for Tedra Slade) 

## 2013-03-25 NOTE — Progress Notes (Signed)
Post Partum Day 1 Subjective: no complaints, up ad lib, voiding, tolerating PO and + flatus  Objective: Blood pressure 119/80, pulse 93, temperature 98.1 F (36.7 C), temperature source Oral, resp. rate 18, height 5\' 7"  (1.702 m), weight 108.41 kg (239 lb), SpO2 100.00%, unknown if currently breastfeeding.  Physical Exam:  General: alert, cooperative and no distress Lochia: appropriate Uterine Fundus: firm Incision: N/A DVT Evaluation: No evidence of DVT seen on physical exam. Negative Homan's sign. No cords or calf tenderness. No significant calf/ankle edema.   Recent Labs  03/23/13 1415 03/24/13 0750  HGB 11.9* 9.1*  HCT 37.1 28.2*    Assessment/Plan: Plan for discharge tomorrow, Breastfeeding and Contraception Depo Pt GBS Positive, Baby/Mom for D/C tomorrow   LOS: 2 days   LEFTWICH-KIRBY, Tramel Westbrook 03/25/2013, 7:33 AM

## 2013-03-25 NOTE — Lactation Note (Signed)
This note was copied from the chart of Casey Rohini Touraoua-Djibril. Lactation Consultation Note  Patient Name: Casey Gonzalez [id number] Date: 03/25/2013  Mother has pumped today but not expressed milk. She reports a history of low milk supply with previous baby and stopped breastfeeding at 3 months because "I did not make enough milk". Instructed to pump every 3 hours/ at least 8 times in 24 hours for best stimulation and removal of milk. Patient is enrolled with WIC and plans to get a DEBP after discharge. Praised for pumping for her baby.    Maternal Data    Feeding    LATCH Score/Interventions                      Lactation Tools Discussed/Used     Consult Status      Christella Hartigan M 03/25/2013, 5:00 PM

## 2013-03-26 MED ORDER — IBUPROFEN 600 MG PO TABS: 600.0000 mg | ORAL_TABLET | Freq: Four times a day (QID) | ORAL | Status: AC

## 2013-03-26 NOTE — Progress Notes (Signed)
[place] interpretor used for patient care. Speaks french.

## 2013-03-26 NOTE — Discharge Instructions (Signed)
Delivery vaginale, soins aprs Reportez-vous  cette feuille dans les prochaines semaines. Ces instructions de dcharge vous fournissent des informations sur les soins pour vous-mme Engineer, maintenance. Votre fournisseur de [employer] galement vous donner des instructions spcifiques. Votre traitement a t planifi selon les pratiques mdicales les plus rcentes disponibles, mais des problmes se produisent parfois. Appelez votre fournisseur de soins si vous avez des problmes ou questions aprs vous rentrez Surveyor, minerals vous. INSTRUCTIONS DE SOINS  DOMICILE Prenez over-the-counter ou de mdicaments d'ordonnance seulement comme dirig par votre fournisseur de soins ou Surveyor, minerals. Ne buvez pas d'alcool, surtout si vous Magazine features editor ou de prendre des mdicaments pour Financial planner. Ne pas mcher ou de la fume de tabac. Ne pas utiliser de [employer]. Continuer  utiliser de [employer] prine. De bons soins du prine comprend: Pharmacist, community prine d'avant en arrire. Garder votre prine propre. Ne utilisez pas de tampons ou de douche jusqu' ce que votre soignant dit que ce est correct. Douche, lavez vos cheveux, et prendre des bains de baignoire comme indiqu par votre fournisseur de [place]. Porter un soutien-gorge bien ajust qui offre un soutien du sein. Mangez des [employer]. Buvez suffisamment de liquides pour garder votre urine claire ou jaune ple. Mangez des aliments riches en fibres tels que les crales  grains entiers et le pain, le riz brun, les haricots et les fruits et lgumes frais tous les jours. Ces aliments peuvent aider  prvenir ou  soulager la constipation. Suivez les Medical sales representative de votre aides familiaux rsidants concernant la reprise des activits telles que [employer] escaliers, de conduite, de levage, exercice, ou en dplacement. Parlez  votre fournisseur de soins  propos de la reprise des activits sexuelles. Reprise des [employer]  dpend de votre risque d'infection, votre taux de gurison, et votre confort et le dsir de reprendre une activit sexuelle. Essayez d'avoir quelqu'un de vous aider avec vos activits mnagres et votre nouveau-n pendant au moins quelques jours aprs avoir quitt l'hpital. Reposer autant que possible. Essayez de vous reposer ou faire une sieste lorsque votre nouveau-n dort. Augmentez vos activits progressivement. Carole Binning tous vos rendez-vous post-partum rguliers. Il est trs important de garder vos rendez-vous de suivi rguliers. Lors de [employer], votre entourage sera vrifi pour se assurer que vous tes la gurison physiquement et [employer]. Obtenir des soins si: Vous tes de passage de gros caillots de votre vagin. Enregistrez les caillots de Scientist, physiological votre fournisseur de [employer]. Vous avez une dcharge nausabonde de votre vagin. Vous avez des Pharmacist, community. Vous urinez frquemment. Vous avez des douleurs quand vous urinez. Vous avez un changement dans vos selles. Vous avez de plus en plus rougeur, douleur, gonflement ou prs de votre incision vaginale (pisiotomie) ou dchirure vaginale. Vous avez pus se coule de votre pisiotomie ou dchirure vaginale. Votre pisiotomie ou dchirure vaginale est sparent. Vous avez les seins douloureux, durs, ou rougies. Vous avez un mal de tte svre. Vous avez une vision floue ou voir des [place]. Vous vous sentez triste ou dprim. Vous avez des penses de vous-mme ou votre nouveau-n blesser. Vous avez des questions concernant vos soins, le soin de votre nouveau-n, ou des mdicaments. Vous tes tourdi. Vous avez une ruption cutane. Vous avez des [place] ou des vomissements. Vous tiez allaitez et ne avez pas eu de menstruations dans les 12 semaines aprs avoir Animal nutritionist. Vous ne allaitez pas et ne avez pas eu de menstruations par la 12me semaine aprs l'accouchement. Vous avez de la fivre. Consulter  immdiatement SOINS MDICAUX SI: Vous avez une douleur persistante. Vous avez des PPL Corporationdouleurs thoraciques. Vous avez essoufflement. Vous vous vanouissez. Vous avez des OGE Energydouleurs  la jambe. Vous avez des Hess Corporationdouleurs  l'estomac. Votre saignements vaginaux sature deux ou plusieurs serviettes hyginiques en 1 heure. FAIS-VOUS SR: Comprendre ces instructions. Va regarder votre condition. Obtiendront de Celanese Corporationl'aide tout de suite si vous ne faites pas bien ou se Engineer, building servicesaggraver. Document publi: 15.12.2001 document rvis: 02/02/2012 Document de rvision: 14/10/2011 ExitCare information des patients  2014 [place]Care, [place].

## 2013-03-26 NOTE — Discharge Summary (Signed)
Obstetric Discharge Summary Reason for Admission: induction of labor for oligohydramnios. Prenatal Procedures: NST Intrapartum Procedures: VBAC Postpartum Procedures: none Complications-Operative and Postpartum: none Hemoglobin  Date Value Range Status  03/24/2013 9.1* 12.0 - 15.0 g/dL Final     DELTA CHECK NOTED     REPEATED TO VERIFY     HCT  Date Value Range Status  03/24/2013 28.2* 36.0 - 46.0 % Final  28 y.o. female presenting for IOL for oligohydramnios. Desires a TOLAC with consent signed. Prior c-section related to [place]ohydramnios [place]  Delivery Note At 12:11 AM a viable female was delivered via VBAC, Spontaneous (Presentation: Right Occiput Anterior).  APGAR: 9, 9; weight pending.   Placenta status: Intact, Spontaneous.  Cord: 3 vessels with the following complications: None.  Cord pH: NA. Anesthesia: None  Episiotomy: None Lacerations: 2nd degree;Perineal;Cervical Suture Repair: 3.0 vicryl rapide Est. Blood Loss (mL): 450 Mom to postpartum.  Baby to Couplet care / Skin to Skin. Placenta to: BS Feeding: Breast Circ: NA Contraception: POPs/Depo  Uncomplicated postpartum course. Discharged on PPD#2.  Physical Exam:  BP 121/74  Pulse 79  Temp(Src) 97.8 F (36.6 C) (Oral)  Resp 18  Ht 5\' 7"  (1.702 m)  Wt 239 lb (108.41 kg)  BMI 37.42 kg/m2  SpO2 100% General: alert and no distress Lochia: appropriate Uterine Fundus: firm DVT Evaluation: No evidence of DVT seen on physical exam.Negative Homan's sign.  Discharge Diagnoses: Term Pregnancy-delivered  Discharge Information: Date: 03/26/2013 Activity: As per discharge instructions Diet: routine Medications: PNV and Ibuprofen Condition: stable Instructions: As per discharge instructions Discharge to: home  Discharge instructions reviewed with the help of a phone [place] interpreter Follow-up Information   [facility] weeks. (Postpartum check)    Contact  information:   [address] [place] [address] [phone]      Newborn Data: Live born female  Birth Weight: 5 lb 13.4 oz (2648 g) APGAR: 9, 9 Admitted to Neonatal ICU due to infection.  Aurora Rody A 03/26/2013, 11:26 AM

## 2013-03-26 NOTE — Lactation Note (Signed)
This note was copied from the chart of Casey Derica Gonzalez. Lactation Consultation Note; [facility]. [employer] rental completed. Reports that she has pumped 3 times today. Encouraged to pump q 3 ours to promote a good milk supply. No questions at present,  Patient Name: Casey Gonzalez JXBJY'NToday's Date: 03/26/2013     Maternal Data    Feeding    LATCH Score/Interventions                      Lactation Tools Discussed/Used     Consult Status      Pamelia HoitWeeks, Grant Swager D 03/26/2013, 4:03 PM

## 2013-03-27 ENCOUNTER — Inpatient Hospital Stay (HOSPITAL_COMMUNITY): Admission: RE | Admit: 2013-03-27 | Payer: Self-pay | Source: Ambulatory Visit

## 2013-04-02 ENCOUNTER — Encounter: Payer: Self-pay | Admitting: Obstetrics and Gynecology

## 2013-04-04 ENCOUNTER — Inpatient Hospital Stay (HOSPITAL_COMMUNITY)
Admission: AD | Admit: 2013-04-04 | Discharge: 2013-04-04 | Disposition: A | Discharge status: Home / Self Care | Payer: MEDICAID | Source: Ambulatory Visit | Attending: Obstetrics & Gynecology | Admitting: Obstetrics & Gynecology

## 2013-04-04 DIAGNOSIS — O8620 Urinary tract infection following delivery, unspecified: Secondary | ICD-10-CM

## 2013-04-04 DIAGNOSIS — O2390 Unspecified genitourinary tract infection in pregnancy, unspecified trimester: Secondary | ICD-10-CM | POA: Insufficient documentation

## 2013-04-04 DIAGNOSIS — N390 Urinary tract infection, site not specified: Secondary | ICD-10-CM | POA: Insufficient documentation

## 2013-04-04 DIAGNOSIS — R109 Unspecified abdominal pain: Secondary | ICD-10-CM | POA: Insufficient documentation

## 2013-04-04 DIAGNOSIS — R300 Dysuria: Secondary | ICD-10-CM | POA: Insufficient documentation

## 2013-04-04 LAB — CBC
HCT: 31.9 % — ABNORMAL LOW (ref 36.0–46.0)
HEMOGLOBIN: 9.9 g/dL — AB (ref 12.0–15.0)
MCH: 28.3 pg (ref 26.0–34.0)
MCHC: 31 g/dL (ref 30.0–36.0)
MCV: 91.1 fL (ref 78.0–100.0)
Platelets: 457 10*3/uL — ABNORMAL HIGH (ref 150–400)
RBC: 3.5 MIL/uL — AB (ref 3.87–5.11)
RDW: 13.1 % (ref 11.5–15.5)
WBC: 8.1 10*3/uL (ref 4.0–10.5)

## 2013-04-04 LAB — URINALYSIS, ROUTINE W REFLEX MICROSCOPIC
Bilirubin Urine: NEGATIVE
Glucose, UA: NEGATIVE mg/dL
Ketones, ur: NEGATIVE mg/dL
Nitrite: NEGATIVE
Protein, ur: NEGATIVE mg/dL
UROBILINOGEN UA: 0.2 mg/dL (ref 0.0–1.0)
pH: 5.5 (ref 5.0–8.0)

## 2013-04-04 LAB — URINE MICROSCOPIC-ADD ON

## 2013-04-04 MED ORDER — CEPHALEXIN 500 MG PO CAPS
500.0000 mg | ORAL_CAPSULE | Freq: Once | ORAL | Status: AC
  Administered 2013-04-04: 500 mg via ORAL
  Filled 2013-04-04: qty 1

## 2013-04-04 MED ORDER — CEPHALEXIN 500 MG PO CAPS: 500.0000 mg | ORAL_CAPSULE | Freq: Four times a day (QID) | ORAL | Status: DC

## 2013-04-04 MED ORDER — OXYCODONE-ACETAMINOPHEN 5-325 MG PO TABS
1.0000 | ORAL_TABLET | Freq: Once | ORAL | Status: AC
  Administered 2013-04-04: 1 via ORAL
  Filled 2013-04-04: qty 1

## 2013-04-04 MED ORDER — OXYCODONE-ACETAMINOPHEN 5-325 MG PO TABS: 1.0000 | ORAL_TABLET | ORAL | Status: AC | PRN

## 2013-04-04 NOTE — MAU Note (Addendum)
28 yo, s/p SVD 03/24/13 presents to MAU with vaginal pain and pain with standing since birth. Pain increased since delivery. Reports VB has decreased; wearing pad although no blood noted today. Some pain with urination.  Denies fever, chills, N/V.  Patient reports she had stitched repair at delivery.  Ibuprofen 600 mg at 1200. Is breastfeeding. Interpreter line utilized - [place].

## 2013-04-04 NOTE — Discharge Instructions (Signed)
Urinary Tract Infection °A urinary tract infection (UTI) can occur any place along the urinary tract. The tract includes the kidneys, ureters, bladder, and urethra. A type of germ called bacteria often causes a UTI. UTIs are often helped with antibiotic medicine.  °HOME CARE  °· If given, take antibiotics as told by your doctor. Finish them even if you start to feel better. °· Drink enough fluids to keep your pee (urine) clear or pale yellow. °· Avoid tea, drinks with caffeine, and bubbly (carbonated) drinks. °· Pee often. Avoid holding your pee in for a long time. °· Pee before and after having sex (intercourse). °· Wipe from front to back after you poop (bowel movement) if you are a woman. Use each tissue only once. °GET HELP RIGHT AWAY IF:  °· You have back pain. °· You have lower belly (abdominal) pain. °· You have chills. °· You feel sick to your stomach (nauseous). °· You throw up (vomit). °· Your burning or discomfort with peeing does not go away. °· You have a fever. °· Your symptoms are not better in 3 days. °MAKE SURE YOU:  °· Understand these instructions. °· Will watch your condition. °· Will get help right away if you are not doing well or get worse. °Document Released: 08/29/2007 Document Revised: 12/05/2011 Document Reviewed: 10/11/2011 °ExitCare® Patient Information ©2014 ExitCare, LLC. ° °

## 2013-04-04 NOTE — MAU Note (Signed)
Pt states vaginal delivery 03/24/2013. Here for internal vaginal pain. Did have stitches placed. Will use [employer] interpreter for other info as pt speaks [place] primarily

## 2013-04-04 NOTE — MAU Provider Note (Signed)
Chief Complaint: Postpartum Complications   First Provider Initiated Contact with Patient 04/04/13 1604     SUBJECTIVE HPI: Casey Gonzalez is a 28 y.o. G2P2002 at 11 days S/P successful VBAC 2/ second degree perineal laceration who presents with low abd pain, mild dysuria and episodes of leaking urine before getting to bathroom gradually worsening over the past several days. Minimal improvement qw/ Ibuprofen 600 mg. Denies fever, chills, laceration pain, frequency, flank pain or GI complaints. Last BM today. Scant lochia, normal odor. Unsure if she is having hematuria.   [place] interpreter used.    Past Medical History  Diagnosis Date  . Medical history non-contributory    OB History  Gravida Para Term Preterm AB SAB TAB Ectopic Multiple Living  2 2 2       2     # Outcome Date GA Lbr Len/2nd Weight Sex Delivery Anes PTL Lv  2 TRM 03/24/13 [redacted]w[redacted]d 04:43 / 02:04 2.648 kg (5 lb 13.4 oz) F VBAC None  Y  1 TRM 08/24/10 [redacted]w[redacted]d  2 kg (4 lb 6.6 oz) M LTCS Spinal  Y     Comments: olihydraminos     Past Surgical History  Procedure Laterality Date  . Cesarean section  2012   History   Social History  . Marital Status: Single    Spouse Name: N/A    Number of Children: N/A  . Years of Education: N/A   Occupational History  . Not on file.   Social History Main Topics  . Smoking status: Never Smoker   . Smokeless tobacco: Never Used  . Alcohol Use: No  . Drug Use: No  . Sexual Activity: Not Currently   Other Topics Concern  . Not on file   Social History Narrative  . No narrative on file   No current facility-administered medications on file prior to encounter.   Current Outpatient Prescriptions on File Prior to Encounter  Medication Sig Dispense Refill  . Alum & Mag Hydroxide-Simeth (ANTACID I PO) Take 1 tablet by mouth as needed.      . ferrous sulfate 325 (65 FE) MG tablet Take 325 mg by mouth at bedtime.       Marland Kitchen ibuprofen (ADVIL,MOTRIN) 600 MG tablet Take 1 tablet  (600 mg total) by mouth every 6 (six) hours.  60 tablet  3  . Prenatal Vit-Fe Fumarate-FA (PRENATAL MULTIVITAMIN) TABS tablet Take 1 tablet by mouth daily at 12 noon.       No Known Allergies  ROS: Pertinent items in HPI  OBJECTIVE Blood pressure 133/71, pulse 68, temperature 97.8 F (36.6 C), temperature source Oral, resp. rate 18, height 5\' 7"  (1.702 m), weight 106.822 kg (235 lb 8 oz), currently breastfeeding. GENERAL: Well-developed, well-nourished female in no acute distress.  HEENT: Normocephalic HEART: normal rate RESP: normal effort ABDOMEN: Soft, non-tender. Fundus at [facility]. No CVAT. Pos BS. EXTREMITIES: Nontender, no edema NEURO: Alert and oriented PELVIC EXAM: NEFG, Laceration healing well. NT. Sutures in place. Scant tan lochia, normal odor. No blood on pad.  BIMANUAL: cervix closed; uterus involuting well, no adnexal tenderness or masses.  LAB RESULTS Results for orders placed during the hospital encounter of 04/04/13 (from the past 24 hour(s))  URINALYSIS, ROUTINE W REFLEX MICROSCOPIC     Status: Abnormal   Collection Time    04/04/13  2:25 PM      Result Value Range   Color, Urine YELLOW  YELLOW   APPearance CLEAR  CLEAR   Specific Gravity, Urine <1.005 (*)  1.005 - 1.030   pH 5.5  5.0 - 8.0   Glucose, UA NEGATIVE  NEGATIVE mg/dL   Hgb urine dipstick LARGE (*) NEGATIVE   Bilirubin Urine NEGATIVE  NEGATIVE   Ketones, ur NEGATIVE  NEGATIVE mg/dL   Protein, ur NEGATIVE  NEGATIVE mg/dL   Urobilinogen, UA 0.2  0.0 - 1.0 mg/dL   Nitrite NEGATIVE  NEGATIVE   Leukocytes, UA LARGE (*) NEGATIVE  URINE MICROSCOPIC-ADD ON     Status: Abnormal   Collection Time    04/04/13  2:25 PM      Result Value Range   Squamous Epithelial / LPF RARE  RARE   WBC, UA 11-20  <3 WBC/hpf   RBC / HPF 3-6  <3 RBC/hpf   Bacteria, UA MANY (*) RARE  CBC     Status: Abnormal   Collection Time    04/04/13  3:29 PM      Result Value Range   WBC 8.1  4.0 - 10.5 K/uL   RBC 3.50 (*) 3.87 - 5.11  MIL/uL   Hemoglobin 9.9 (*) 12.0 - 15.0 g/dL   HCT 82.931.9 (*) 56.236.0 - 13.046.0 %   MCV 91.1  78.0 - 100.0 fL   MCH 28.3  26.0 - 34.0 pg   MCHC 31.0  30.0 - 36.0 g/dL   RDW 86.513.1  78.411.5 - 69.615.5 %   Platelets 457 (*) 150 - 400 K/uL    IMAGING NA  MAU COURSE First dose Keflex given.  ASSESSMENT 1. Postpartum UTI (urinary tract infection)    PLAN Discharge home in stable condition. Push fluids.  Follow-up Information   [facility] (as scheduled or if no improvement in 3 days. )    Specialty:  Obstetrics and Gynecology   Contact information:   [address] [place] [address] [phone]      [facility]. (As needed for fever greater than 100.4, severe pain or vomiting too much to keep down your medication.)    Contact information:   [address] [id number] [place] [id number] [phone]       Medication List         ANTACID I PO  Take 1 tablet by mouth as needed.     cephALEXin 500 MG capsule  Commonly known as:  KEFLEX  Take 1 capsule (500 mg total) by mouth 4 (four) times daily.     ferrous sulfate 325 (65 FE) MG tablet  Take 325 mg by mouth at bedtime.     ibuprofen 600 MG tablet  Commonly known as:  ADVIL,MOTRIN  Take 1 tablet (600 mg total) by mouth every 6 (six) hours.     oxyCODONE-acetaminophen 5-325 MG per tablet  Commonly known as:  PERCOCET/ROXICET  Take 1-2 tablets by mouth every 4 (four) hours as needed.     prenatal multivitamin Tabs tablet  Take 1 tablet by mouth daily at 12 noon.         [place] Deretha [place], [place] 04/04/2013  4:37 PM

## 2013-04-06 NOTE — MAU Provider Note (Signed)
Attestation of Attending Supervision of Advanced Practitioner (CNM/NP): Evaluation and management procedures were performed by the Advanced Practitioner under my supervision and collaboration.  I have reviewed the Advanced Practitioner's note and chart, and I agree with the management and plan.  HARRAWAY-SMITH, Hasini Peachey 4:02 PM     

## 2013-04-07 LAB — URINE CULTURE: Colony Count: 50000

## 2013-04-08 ENCOUNTER — Other Ambulatory Visit: Payer: Self-pay | Admitting: Advanced Practice Midwife

## 2013-04-08 DIAGNOSIS — O8620 Urinary tract infection following delivery, unspecified: Secondary | ICD-10-CM

## 2013-04-08 MED ORDER — NITROFURANTOIN MONOHYD MACRO 100 MG PO CAPS: 100.0000 mg | ORAL_CAPSULE | Freq: Two times a day (BID) | ORAL | Status: DC

## 2013-04-08 NOTE — Progress Notes (Signed)
Urine culture MRSA pos. Not sensitive to Keflex already RX'd. Change to Macrobid. L2 for breastfeeding.

## 2013-04-09 NOTE — Progress Notes (Signed)
Called pt. With pacific interpreter [phone]. Called home number and unable to leave message as mailbox was not set up. Called cell phone, left message stating we are calling about information of a prescription change and to inform you of a prescription that has been sent to her pharmacy. Can call clinic for more information. Will send letter as it may be difficult to get a hold of patient.

## 2013-04-29 ENCOUNTER — Ambulatory Visit: Payer: Self-pay | Admitting: Obstetrics and Gynecology

## 2013-04-30 ENCOUNTER — Encounter: Payer: Self-pay | Admitting: Nurse Practitioner

## 2013-04-30 ENCOUNTER — Ambulatory Visit (INDEPENDENT_AMBULATORY_CARE_PROVIDER_SITE_OTHER): Payer: Self-pay | Admitting: Nurse Practitioner

## 2013-04-30 DIAGNOSIS — Z309 Encounter for contraceptive management, unspecified: Secondary | ICD-10-CM

## 2013-04-30 MED ORDER — NORGESTIMATE-ETH ESTRADIOL 0.25-35 MG-MCG PO TABS: 1.0000 | ORAL_TABLET | Freq: Every day | ORAL | Status: AC

## 2013-04-30 NOTE — Progress Notes (Signed)
Patient ID: Casey FussSalyma Touraoua-Djibril, female   DOB: [birth year], 28 y.o.   MRN: [id number] History:  Casey FussSalyma Touraoua-Djibril is a 28 y.o. [id number] [facility] and contraception. She delivered [employer] baby girl. She did receive a second degree tear that has healed. She is breast and bottle feeding. She denies any depression. She has not started menses or intercourse. She is interested  in starting birth control pills. She has been on them in the past without any problems.   The following portions of the patient's history were reviewed and updated as appropriate: allergies, current medications, past family history, past medical history, past social history, past surgical history and problem list.  Review of Systems:  Pertinent items are noted in HPI.  Objective:  Physical Exam BP 126/84  Pulse 76  Temp(Src) 97.5 F (36.4 C) (Oral)  Wt 228 lb 4.8 oz (103.556 kg)  Breastfeeding? Yes GENERAL: Well-developed, well-nourished female in no acute distress.  HEENT: Normocephalic, atraumatic.  NECK: Supple. Normal thyroid.  LUNGS: Normal rate. Clear to auscultation bilaterally.  HEART: Regular rate and rhythm with no adventitious sounds.  BREASTS: Symmetric in size. No masses, skin changes, nipple drainage, or lymphadenopathy. ABDOMEN: Soft, nontender, nondistended. No organomegaly. Normal bowel sounds appreciated in all quadrants.  PELVIC: Normal external female genitalia. Vagina is pink and rugated.  Normal discharge. Normal cervix contour.  Uterus is normal in size. No adnexal mass or tenderness.  EXTREMITIES: No cyanosis, clubbing, or edema, 2+ distal pulses.   Labs and Imaging No results found.  Assessment & Plan:  Assessment:  Postpartum care contraception  Plans:  OrthoCyclen BCP X 1 year Advised to use condoms X 1 month Return as needed  Delbert PhenixLinda M Keondrick Dilks, NP 04/30/2013 4:43 PM

## 2013-04-30 NOTE — Patient Instructions (Signed)
Contraception Choices °Birth control (contraception) is the use of any methods or devices to stop pregnancy from happening. Below are some methods to help avoid pregnancy. °HORMONAL BIRTH CONTROL °· A small tube put under the skin of the upper arm (implant). The tube can stay in place for 3 years. The implant must be taken out after 3 years. °· Shots given every 3 months. °· Pills taken every day. °· Patches that are changed once a week. °· A ring put into the vagina (vaginal ring). The ring is left in place for 3 weeks and removed for 1 week. Then, a new ring is put in the vagina. °· Emergency birth control pills taken after unprotected sex (intercourse). °BARRIER BIRTH CONTROL  °· A thin covering worn on the penis (female condom) during sex. °· A soft, loose covering put into the vagina (female condom) before sex. °· A rubber bowl that sits over the cervix (diaphragm). The bowl must be made for you. The bowl is put into the vagina before sex. The bowl is left in place for 6 to 8 hours after sex. °· A small, soft cup that fits over the cervix (cervical cap). The cup must be made for you. The cup can be left in place for 48 hours after sex. °· A sponge that is put into the vagina before sex. °· A chemical that kills or stops sperm from getting into the cervix and uterus (spermicide). The chemical may be a cream, jelly, foam, or pill. °INTRAUTERINE (IUD) BIRTH CONTROL  °· IUD birth control is a small, T-shaped piece of plastic. The plastic is put inside the uterus. There are 2 types of IUD: °· Copper IUD. The IUD is covered in copper wire. The copper makes a fluid that kills sperm. It can stay in place for 10 years. °· Hormone IUD. The hormone stops pregnancy from happening. It can stay in place for 5 years. °PERMANENT METHODS °· When the woman has her fallopian tubes sealed, tied, or blocked during surgery. This stops the egg from traveling to the uterus. °· The doctor places a small coil or insert into each fallopian  tube. This causes scar tissue to form and blocks the fallopian tubes. °· When the female has the tubes that carry sperm tied off (vasectomy). °NATURAL FAMILY PLANNING BIRTH CONTROL  °· Natural family planning means not having sex or using barrier birth control on the days the woman could become pregnant. °· Use a calendar to keep track of the length of each period and know the days she can get pregnant. °· Avoid sex during ovulation. °· Use a thermometer to measure body temperature. Also watch for symptoms of ovulation. °· Time sex to be after the woman has ovulated. °Use condoms to help protect yourself against sexually transmitted infections (STIs). Do this no matter what type of birth control you use. Talk to your doctor about which type of birth control is best for you. °Document Released: 01/07/2009 Document Revised: 11/12/2012 Document Reviewed: 10/01/2012 °ExitCare® Patient Information ©2014 ExitCare, LLC. ° °

## 2013-05-08 ENCOUNTER — Encounter: Payer: Self-pay | Admitting: *Deleted

## 2014-01-25 ENCOUNTER — Encounter: Payer: Self-pay | Admitting: Nurse Practitioner

## 2015-02-27 IMAGING — US US OB FOLLOW-UP
1 series · 14 of 28 positions shown · non-contrast
Comparison: none

[Series 1: us ob follow up · 14 of 47 slices shown]
[im 2/47]
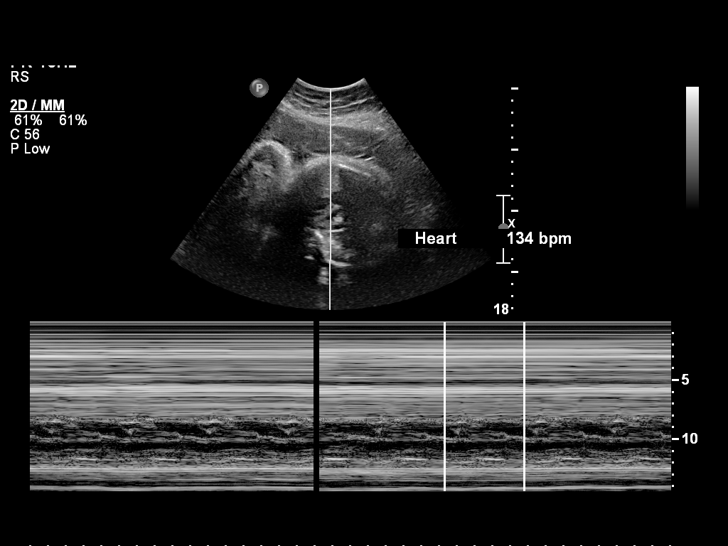
[im 6/47]
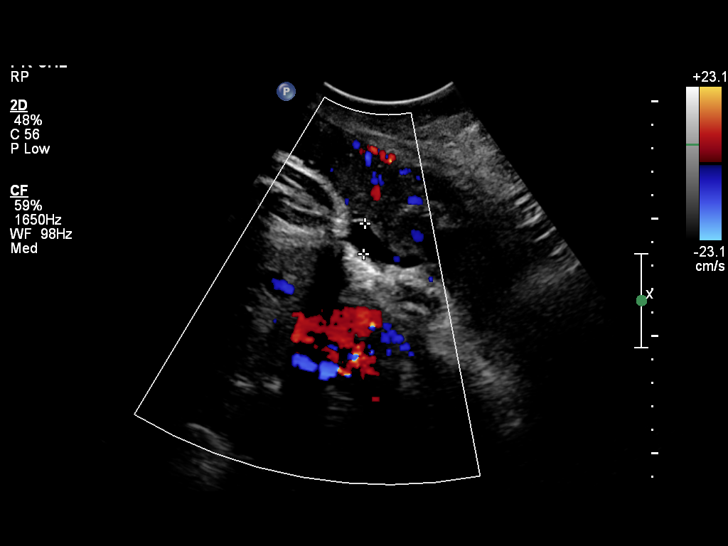
[im 9/47]
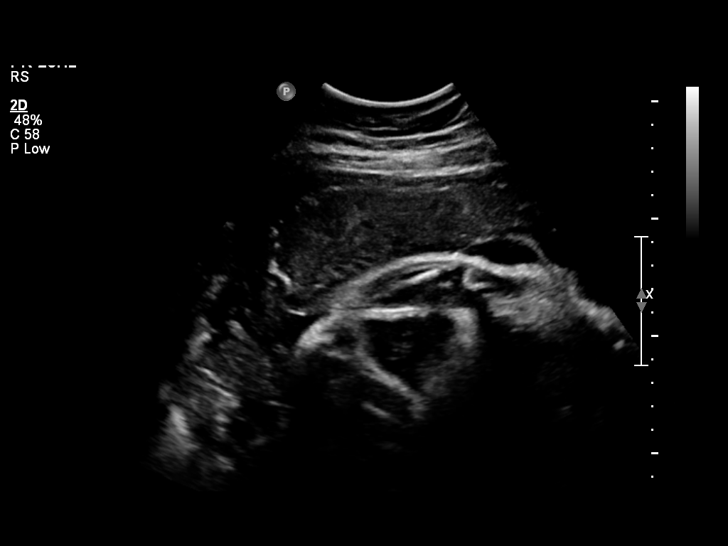
[im 12/47]
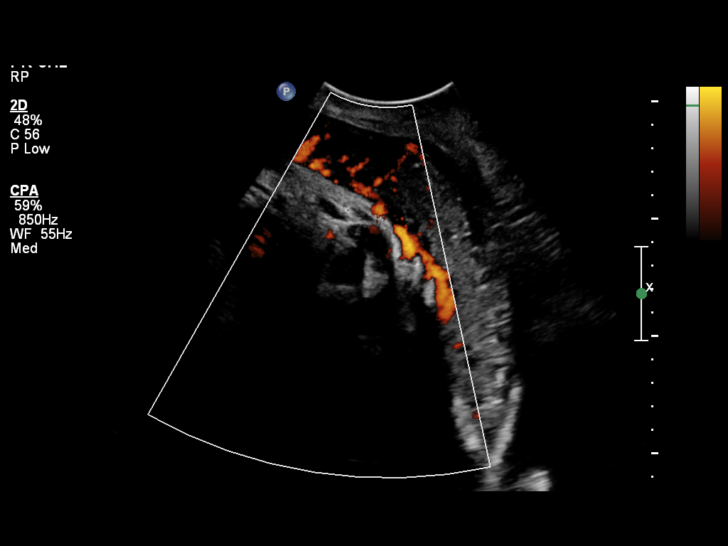
[im 16/47]
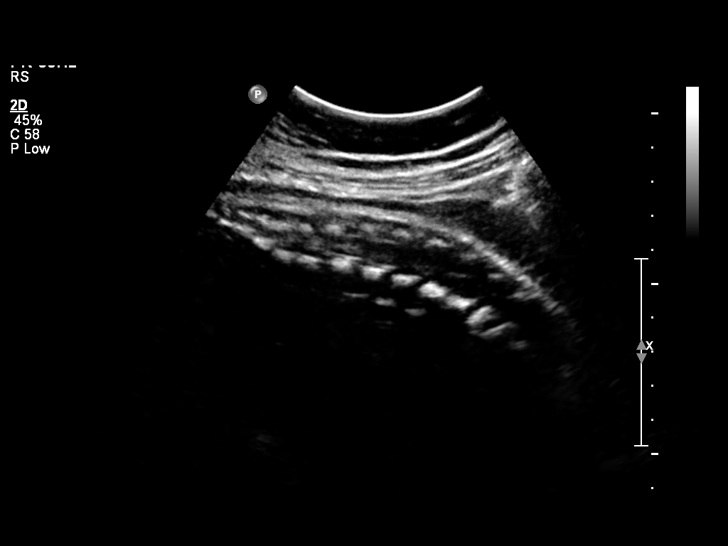
[im 19/47]
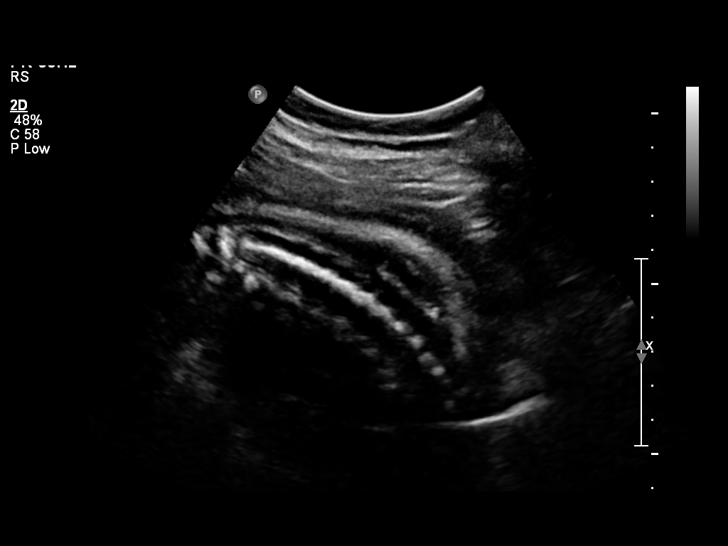
[im 23/47]
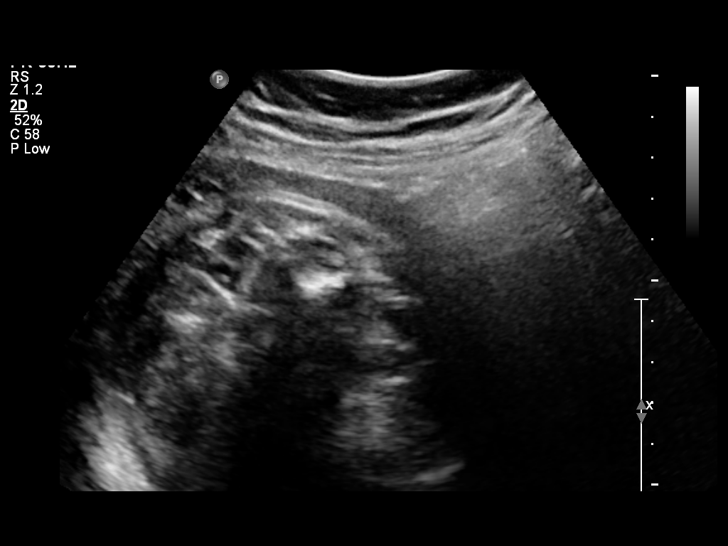
[im 26/47]
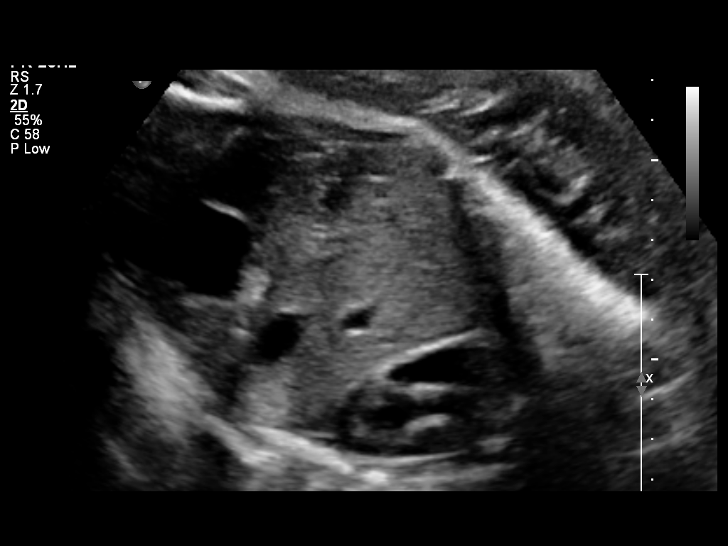
[im 29/47]
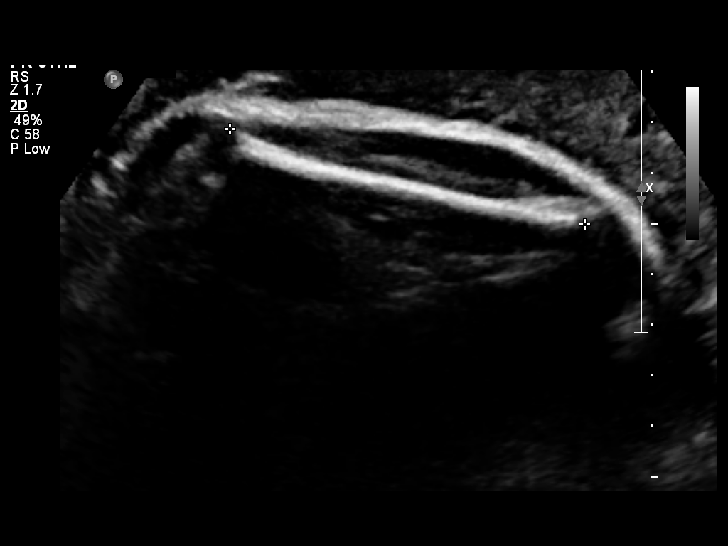
[im 33/47]
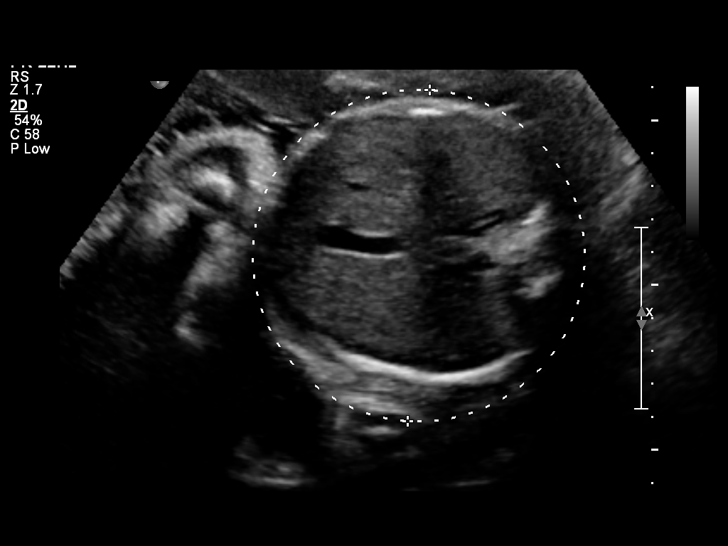
[im 36/47]
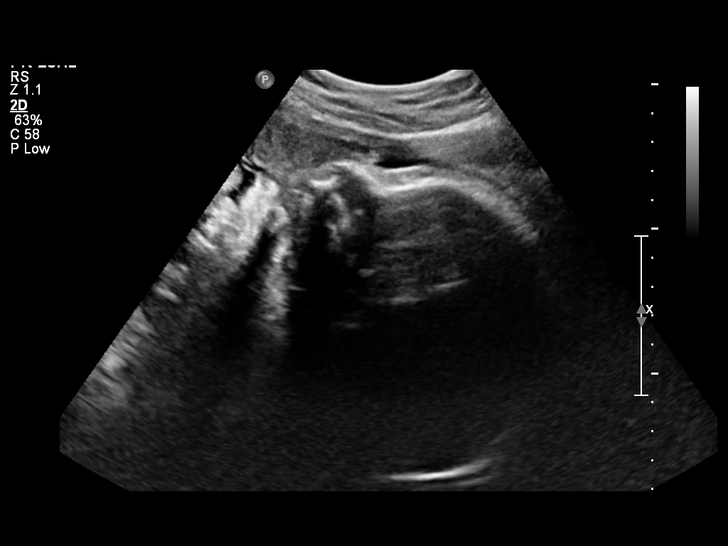
[im 40/47]
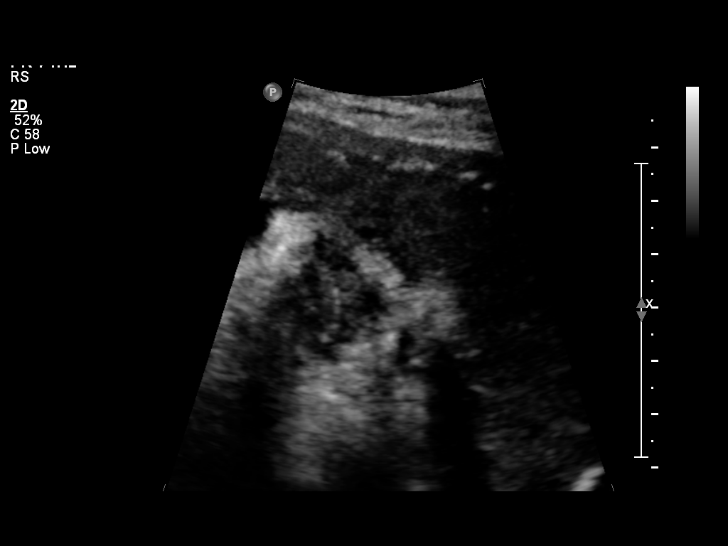
[im 43/47]
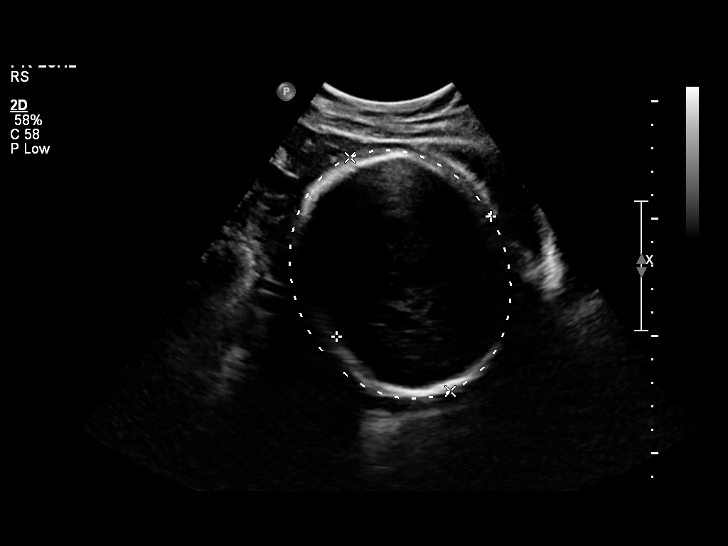
[im 47/47]
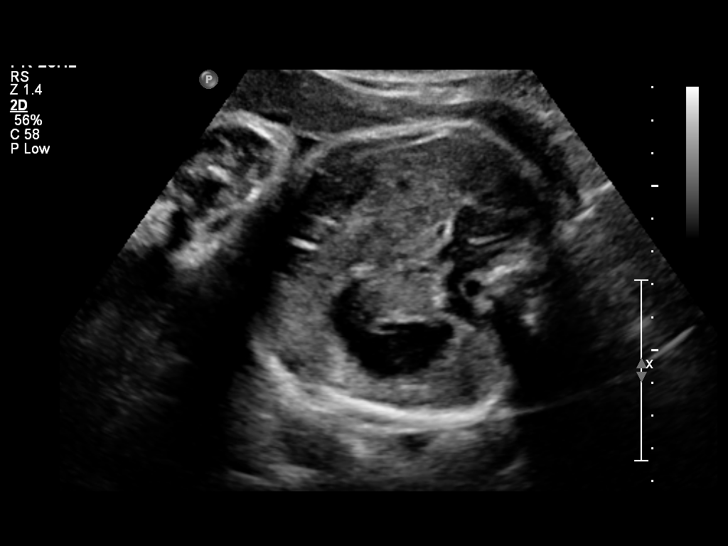

[14 of 28 positions shown; findings below may reference images not displayed]

Canned report from images found in remote index.

Refer to host system for actual result text.
